# Patient Record
Sex: Female | Born: 1970 | Race: White | Hispanic: No | Smoking: Current every day smoker
Health system: Southern US, Community
[De-identification: ages and names within clinical notes are randomized; demographics above are authoritative.]

## PROBLEM LIST (undated history)

## (undated) DIAGNOSIS — F410 Panic disorder [episodic paroxysmal anxiety] without agoraphobia: Secondary | ICD-10-CM

## (undated) DIAGNOSIS — F99 Mental disorder, not otherwise specified: Secondary | ICD-10-CM

## (undated) DIAGNOSIS — F32A Depression, unspecified: Secondary | ICD-10-CM

## (undated) DIAGNOSIS — F1910 Other psychoactive substance abuse, uncomplicated: Secondary | ICD-10-CM

## (undated) DIAGNOSIS — F329 Major depressive disorder, single episode, unspecified: Secondary | ICD-10-CM

## (undated) DIAGNOSIS — F419 Anxiety disorder, unspecified: Secondary | ICD-10-CM

## (undated) DIAGNOSIS — K760 Fatty (change of) liver, not elsewhere classified: Secondary | ICD-10-CM

## (undated) DIAGNOSIS — D649 Anemia, unspecified: Secondary | ICD-10-CM

## (undated) HISTORY — PX: TUBAL LIGATION: SHX77

## (undated) HISTORY — DX: Other psychoactive substance abuse, uncomplicated: F19.10

## (undated) HISTORY — DX: Anemia, unspecified: D64.9

## (undated) HISTORY — DX: Fatty (change of) liver, not elsewhere classified: K76.0

## (undated) HISTORY — PX: OTHER SURGICAL HISTORY: SHX169

## (undated) HISTORY — PX: SPINE SURGERY: SHX786

## (undated) HISTORY — DX: Depression, unspecified: F32.A

## (undated) HISTORY — DX: Mental disorder, not otherwise specified: F99

## (undated) HISTORY — PX: BACK SURGERY: SHX140

## (undated) HISTORY — DX: Major depressive disorder, single episode, unspecified: F32.9

---

## 2000-12-19 ENCOUNTER — Emergency Department (HOSPITAL_COMMUNITY): Admission: EM | Admit: 2000-12-19 | Discharge: 2000-12-19 | Payer: Self-pay | Admitting: Emergency Medicine

## 2002-03-23 ENCOUNTER — Ambulatory Visit (HOSPITAL_COMMUNITY): Admission: RE | Admit: 2002-03-23 | Discharge: 2002-03-23 | Payer: Self-pay | Admitting: Obstetrics and Gynecology

## 2002-03-23 ENCOUNTER — Encounter: Payer: Self-pay | Admitting: Obstetrics and Gynecology

## 2002-03-27 ENCOUNTER — Ambulatory Visit (HOSPITAL_COMMUNITY): Admission: RE | Admit: 2002-03-27 | Discharge: 2002-03-27 | Payer: Self-pay | Admitting: Obstetrics & Gynecology

## 2002-03-27 ENCOUNTER — Encounter: Payer: Self-pay | Admitting: Obstetrics & Gynecology

## 2002-04-03 ENCOUNTER — Encounter (HOSPITAL_COMMUNITY): Admission: RE | Admit: 2002-04-03 | Discharge: 2002-05-03 | Payer: Self-pay | Admitting: Obstetrics & Gynecology

## 2002-04-03 ENCOUNTER — Encounter: Payer: Self-pay | Admitting: Obstetrics & Gynecology

## 2003-04-18 ENCOUNTER — Ambulatory Visit (HOSPITAL_COMMUNITY): Admission: RE | Admit: 2003-04-18 | Discharge: 2003-04-18 | Payer: Self-pay | Admitting: Obstetrics and Gynecology

## 2005-06-14 ENCOUNTER — Emergency Department (HOSPITAL_COMMUNITY): Admission: EM | Admit: 2005-06-14 | Discharge: 2005-06-15 | Payer: Self-pay | Admitting: Emergency Medicine

## 2005-06-22 ENCOUNTER — Ambulatory Visit: Payer: Self-pay | Admitting: Family Medicine

## 2005-07-05 ENCOUNTER — Ambulatory Visit: Payer: Self-pay | Admitting: Family Medicine

## 2005-07-07 ENCOUNTER — Ambulatory Visit (HOSPITAL_COMMUNITY): Admission: RE | Admit: 2005-07-07 | Discharge: 2005-07-07 | Payer: Self-pay | Admitting: Family Medicine

## 2005-07-29 ENCOUNTER — Observation Stay (HOSPITAL_COMMUNITY): Admission: RE | Admit: 2005-07-29 | Discharge: 2005-07-30 | Payer: Self-pay | Admitting: Neurosurgery

## 2005-08-30 ENCOUNTER — Encounter (HOSPITAL_COMMUNITY): Admission: RE | Admit: 2005-08-30 | Discharge: 2005-09-29 | Payer: Self-pay | Admitting: Neurosurgery

## 2005-11-02 ENCOUNTER — Emergency Department (HOSPITAL_COMMUNITY): Admission: EM | Admit: 2005-11-02 | Discharge: 2005-11-02 | Payer: Self-pay | Admitting: Family Medicine

## 2006-01-26 ENCOUNTER — Emergency Department (HOSPITAL_COMMUNITY): Admission: EM | Admit: 2006-01-26 | Discharge: 2006-01-26 | Payer: Self-pay | Admitting: Emergency Medicine

## 2006-10-06 ENCOUNTER — Ambulatory Visit (HOSPITAL_COMMUNITY): Admission: RE | Admit: 2006-10-06 | Discharge: 2006-10-06 | Payer: Self-pay | Admitting: Family Medicine

## 2007-06-15 ENCOUNTER — Encounter: Payer: Self-pay | Admitting: Family Medicine

## 2007-06-30 ENCOUNTER — Emergency Department (HOSPITAL_COMMUNITY): Admission: EM | Admit: 2007-06-30 | Discharge: 2007-06-30 | Payer: Self-pay | Admitting: Emergency Medicine

## 2007-07-17 ENCOUNTER — Ambulatory Visit (HOSPITAL_COMMUNITY): Admission: RE | Admit: 2007-07-17 | Discharge: 2007-07-17 | Payer: Self-pay | Admitting: Family Medicine

## 2007-08-02 ENCOUNTER — Encounter (HOSPITAL_COMMUNITY): Admission: RE | Admit: 2007-08-02 | Discharge: 2007-09-01 | Payer: Self-pay | Admitting: Specialist

## 2008-05-21 ENCOUNTER — Ambulatory Visit (HOSPITAL_COMMUNITY): Admission: RE | Admit: 2008-05-21 | Discharge: 2008-05-21 | Payer: Self-pay | Admitting: Specialist

## 2010-07-16 NOTE — Letter (Signed)
Summary: Historic Patient File  Historic Patient File   Imported By: Lind Guest 03/16/2010 09:39:11  _____________________________________________________________________  External Attachment:    Type:   Image     Comment:   External Document

## 2014-03-26 ENCOUNTER — Other Ambulatory Visit (HOSPITAL_COMMUNITY): Payer: Self-pay | Admitting: Internal Medicine

## 2014-03-26 DIAGNOSIS — R1011 Right upper quadrant pain: Secondary | ICD-10-CM

## 2014-03-29 ENCOUNTER — Ambulatory Visit (HOSPITAL_COMMUNITY)
Admission: RE | Admit: 2014-03-29 | Discharge: 2014-03-29 | Disposition: A | Discharge status: Home / Self Care | Payer: BC Managed Care – PPO | Source: Ambulatory Visit | Attending: Internal Medicine | Admitting: Internal Medicine

## 2014-03-29 ENCOUNTER — Other Ambulatory Visit (HOSPITAL_COMMUNITY): Payer: Self-pay | Admitting: Internal Medicine

## 2014-03-29 DIAGNOSIS — R1011 Right upper quadrant pain: Secondary | ICD-10-CM | POA: Insufficient documentation

## 2016-01-10 ENCOUNTER — Emergency Department (HOSPITAL_COMMUNITY)
Admission: EM | Admit: 2016-01-10 | Discharge: 2016-01-10 | Disposition: A | Discharge status: Home / Self Care | Payer: Self-pay | Attending: Emergency Medicine | Admitting: Emergency Medicine

## 2016-01-10 ENCOUNTER — Encounter (HOSPITAL_COMMUNITY): Payer: Self-pay

## 2016-01-10 DIAGNOSIS — F419 Anxiety disorder, unspecified: Secondary | ICD-10-CM | POA: Insufficient documentation

## 2016-01-10 DIAGNOSIS — F17210 Nicotine dependence, cigarettes, uncomplicated: Secondary | ICD-10-CM | POA: Insufficient documentation

## 2016-01-10 HISTORY — DX: Panic disorder (episodic paroxysmal anxiety): F41.0

## 2016-01-10 HISTORY — DX: Anxiety disorder, unspecified: F41.9

## 2016-01-10 MED ORDER — DULOXETINE HCL 20 MG PO CPEP: 20.0000 mg | ORAL_CAPSULE | Freq: Two times a day (BID) | ORAL | 1 refills | Status: DC

## 2016-01-10 MED ORDER — DULOXETINE HCL 20 MG PO CPEP
20.0000 mg | ORAL_CAPSULE | Freq: Once | ORAL | Status: AC
  Administered 2016-01-10: 20 mg via ORAL
  Filled 2016-01-10: qty 1

## 2016-01-10 NOTE — ED Provider Notes (Signed)
[facility]EM[facility]Y DEPT Provider Note   CSN: [id number] Arrival date & time: 01/10/16  2111  First Provider Contact:   First MD Initiated Contact with Patient 01/10/16 2144       By signing my name below, I, Jennifer Greer, attest that this documentation has been prepared under the direction and in the presence of Raeford Razor, MD. Electronically Signed: Doreatha Greer, ED Scribe. 01/10/16. 9:51 PM.    History   Chief Complaint Chief Complaint  Patient presents with  . Panic Attack    HPI Jennifer Greer is a 45 y.o. female with associated anxiety who presents to the Emergency Department complaining of generalized weakness onset 2 days ago with associated anxiety. Pt states she ran out of her bid 20 mg Cymbalta 2 days ago because she is not able to afford the medication. Per pt, Dr. Margo Aye has prescribed this medication for 2 years and is aware that she has had trouble affording the medication. She reports that she currently has Xanax, but it does not provide relief on its own.  The history is provided by the patient. No language interpreter was used.    Past Medical History:  Diagnosis Date  . Anxiety   . Panic attack     There are no active problems to display for this patient.   Past Surgical History:  Procedure Laterality Date  . BACK SURGERY    . rotar cuff repair    . TUBAL LIGATION      OB History    No data available       Home Medications    Prior to Admission medications   Not on File    Family History History reviewed. No pertinent family history.  Social History Social History  Substance Use Topics  . Smoking status: Current Every Day Smoker    Packs/day: 1.00    Types: Cigarettes  . Smokeless tobacco: Never Used  . Alcohol use No     Allergies   Review of patient's allergies indicates no known allergies.   Review of Systems Review of Systems  Neurological: Positive for weakness (generalized).  Psychiatric/Behavioral: The patient is  nervous/anxious.   All other systems reviewed and are negative.    Physical Exam Updated Vital Signs BP 132/87 (BP Location: Left Arm)   Pulse 97   Temp 98.4 F (36.9 C)   Resp 20   Ht 5\' 9"  (1.753 m)   Wt 156 lb (70.8 kg)   LMP 12/13/2015 (Approximate)   SpO2 100%   BMI 23.04 kg/m   Physical Exam  Constitutional: She appears well-developed and well-nourished. No distress.  HENT:  Head: Normocephalic and atraumatic.  Mouth/Throat: Oropharynx is clear and moist. No oropharyngeal exudate.  Eyes: Conjunctivae and EOM are normal. Pupils are equal, round, and reactive to light. Right eye exhibits no discharge. Left eye exhibits no discharge. No scleral icterus.  Neck: Normal range of motion. Neck supple. No JVD present. No thyromegaly present.  Cardiovascular: Normal rate, regular rhythm, normal heart sounds and intact distal pulses.  Exam reveals no gallop and no friction rub.   No murmur heard. Pulmonary/Chest: Effort normal and breath sounds normal. No respiratory distress. She has no wheezes. She has no rales.  Abdominal: Soft. Bowel sounds are normal. She exhibits no distension and no mass. There is no tenderness.  Musculoskeletal: Normal range of motion. She exhibits no edema or tenderness.  Lymphadenopathy:    She has no cervical adenopathy.  Neurological: She is alert. Coordination normal.  Skin: Skin is warm and dry. No rash noted. No erythema.  Psychiatric: She has a normal mood and affect. Her behavior is normal.  Nursing note and vitals reviewed.    ED Treatments / Results  Labs (all labs ordered are listed, but only abnormal results are displayed) Labs Reviewed - No data to display  EKG  EKG Interpretation None       Radiology No results found.  Procedures Procedures (including critical care time)  DIAGNOSTIC STUDIES: Oxygen Saturation is 100% on RA, normal by my interpretation.    COORDINATION OF CARE: 9:49 PM Discussed treatment plan with pt at  bedside which includes medication refill and pt agreed to plan.    Medications Ordered in ED Medications - No data to display   Initial Impression / Assessment and Plan / ED Course  I have reviewed the triage vital signs and the nursing notes.  Pertinent labs & imaging results that were available during my care of the patient were reviewed by me and considered in my medical decision making (see chart for details).  Clinical Course      Final Clinical Impressions(s) / ED Diagnoses   Final diagnoses:  Anxiety    New Prescriptions New Prescriptions   No medications on file   I personally preformed the services scribed in my presence. The recorded information has been reviewed is accurate. Raeford Razor, MD.    Raeford Razor, MD 01/19/16 [phone]

## 2016-01-10 NOTE — ED Triage Notes (Signed)
I have been off Cymbalta for two days.  Heart palpitations, brain zaps, and hot flashes.

## 2016-02-11 ENCOUNTER — Ambulatory Visit: Payer: Self-pay | Admitting: Obstetrics & Gynecology

## 2016-02-17 ENCOUNTER — Ambulatory Visit: Payer: Self-pay | Admitting: Obstetrics and Gynecology

## 2016-03-29 ENCOUNTER — Other Ambulatory Visit: Payer: Self-pay | Admitting: Adult Health

## 2016-04-06 ENCOUNTER — Encounter: Payer: Self-pay | Admitting: Adult Health

## 2016-04-06 ENCOUNTER — Ambulatory Visit (INDEPENDENT_AMBULATORY_CARE_PROVIDER_SITE_OTHER): Payer: Self-pay | Admitting: Adult Health

## 2016-04-06 ENCOUNTER — Other Ambulatory Visit (HOSPITAL_COMMUNITY)
Admission: RE | Admit: 2016-04-06 | Discharge: 2016-04-06 | Disposition: A | Discharge status: Home / Self Care | Payer: Self-pay | Source: Ambulatory Visit | Attending: Adult Health | Admitting: Adult Health

## 2016-04-06 VITALS — BP 110/62 | HR 96 | Ht 68.5 in | Wt 184.5 lb

## 2016-04-06 DIAGNOSIS — F329 Major depressive disorder, single episode, unspecified: Secondary | ICD-10-CM

## 2016-04-06 DIAGNOSIS — Z1212 Encounter for screening for malignant neoplasm of rectum: Secondary | ICD-10-CM

## 2016-04-06 DIAGNOSIS — Z01419 Encounter for gynecological examination (general) (routine) without abnormal findings: Secondary | ICD-10-CM | POA: Insufficient documentation

## 2016-04-06 DIAGNOSIS — Z1151 Encounter for screening for human papillomavirus (HPV): Secondary | ICD-10-CM | POA: Insufficient documentation

## 2016-04-06 DIAGNOSIS — F32A Depression, unspecified: Secondary | ICD-10-CM

## 2016-04-06 DIAGNOSIS — Z01411 Encounter for gynecological examination (general) (routine) with abnormal findings: Secondary | ICD-10-CM

## 2016-04-06 DIAGNOSIS — N920 Excessive and frequent menstruation with regular cycle: Secondary | ICD-10-CM

## 2016-04-06 LAB — HEMOCCULT GUIAC POC 1CARD (OFFICE): Fecal Occult Blood, POC: NEGATIVE
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
## 2016-04-06 NOTE — Patient Instructions (Addendum)
Physical in 1 year Pap in 3 if normal Get mammogram  Labs in near future Get medicaid

## 2016-04-08 LAB — CYTOLOGY - PAP
Adequacy: ABSENT
Diagnosis: NEGATIVE
HPV (WINDOPATH): NOT DETECTED

## 2016-04-27 ENCOUNTER — Telehealth: Payer: Self-pay | Admitting: Adult Health

## 2016-04-27 NOTE — Telephone Encounter (Signed)
Spoke with pt. Pt is on Cymbalta 20 mg 1 BID. She has an appt to see you 05/04/16. Can you prescribe enough to last her until her appt? Thanks!! JSY

## 2016-04-27 NOTE — Telephone Encounter (Signed)
Spoke with pt letting her know she needs to get Cymbalta from PCP. Pt voiced understanding. JSY

## 2016-05-04 ENCOUNTER — Ambulatory Visit: Payer: Self-pay | Admitting: Adult Health

## 2016-05-17 ENCOUNTER — Ambulatory Visit: Payer: Self-pay | Admitting: Adult Health

## 2016-06-16 ENCOUNTER — Ambulatory Visit: Payer: Self-pay | Admitting: Adult Health

## 2016-07-15 ENCOUNTER — Encounter: Payer: Self-pay | Admitting: Family Medicine

## 2016-07-15 ENCOUNTER — Ambulatory Visit (INDEPENDENT_AMBULATORY_CARE_PROVIDER_SITE_OTHER): Payer: BLUE CROSS/BLUE SHIELD | Admitting: Family Medicine

## 2016-07-15 VITALS — BP 120/78 | HR 100 | Temp 98.1°F | Resp 16 | Ht 69.0 in | Wt 178.0 lb

## 2016-07-15 DIAGNOSIS — Z1231 Encounter for screening mammogram for malignant neoplasm of breast: Secondary | ICD-10-CM

## 2016-07-15 DIAGNOSIS — Z1239 Encounter for other screening for malignant neoplasm of breast: Secondary | ICD-10-CM

## 2016-07-15 DIAGNOSIS — F339 Major depressive disorder, recurrent, unspecified: Secondary | ICD-10-CM | POA: Diagnosis not present

## 2016-07-15 DIAGNOSIS — F1320 Sedative, hypnotic or anxiolytic dependence, uncomplicated: Secondary | ICD-10-CM | POA: Insufficient documentation

## 2016-07-15 DIAGNOSIS — F410 Panic disorder [episodic paroxysmal anxiety] without agoraphobia: Secondary | ICD-10-CM | POA: Diagnosis not present

## 2016-07-15 DIAGNOSIS — K760 Fatty (change of) liver, not elsewhere classified: Secondary | ICD-10-CM | POA: Diagnosis not present

## 2016-07-15 DIAGNOSIS — Z720 Tobacco use: Secondary | ICD-10-CM

## 2016-07-15 DIAGNOSIS — Z7689 Persons encountering health services in other specified circumstances: Secondary | ICD-10-CM | POA: Diagnosis not present

## 2016-07-15 DIAGNOSIS — L409 Psoriasis, unspecified: Secondary | ICD-10-CM

## 2016-07-15 MED ORDER — DULOXETINE HCL 20 MG PO CPEP: 20.0000 mg | ORAL_CAPSULE | Freq: Two times a day (BID) | ORAL | 1 refills | Status: DC

## 2016-07-15 MED ORDER — FUSION PLUS PO CAPS: 1.0000 | ORAL_CAPSULE | Freq: Every day | ORAL | 3 refills | Status: DC

## 2016-07-15 NOTE — Progress Notes (Signed)
Chief Complaint  Patient presents with  . Establish Care   New to establish Just moved back to [place] Separated from husband Accompanied by teenage daughter  Long history anxiety and depression.  Is on duloxetine 20 BID.  Needs Rx today. She also takes xanax 1 mg BID.  She buys this from "friends" because doctors have stopped prescribing.  She cannot get through a day without it.  Has been on it since her 98s. Previously "drank too much".  Had increased liver functions and was told to stop drinking by her prior doctor.  She was able to do this on her own and lost 80 lbs.  I have discussed the multiple health risks associated with cigarette smoking including, but not limited to, cardiovascular disease, lung disease and cancer.  I have strongly recommended that smoking be stopped.  I have reviewed the various methods of quitting including cold [place], classes, nicotine replacements and prescription medications.  I have offered assistance in this difficult process.  The patient is not interested in assistance at this time.    Patient Active Problem List   Diagnosis Date Noted  . Depression, recurrent (HCC) 07/15/2016  . Panic disorder 07/15/2016  . Fatty liver 07/15/2016  . Benzodiazepine dependence (HCC) 07/15/2016  . Psoriasis 07/15/2016  . Tobacco abuse 07/15/2016    Outpatient Encounter Prescriptions as of 07/15/2016  Medication Sig  . ALPRAZolam (XANAX) 1 MG tablet Take 1 mg by mouth 3 (three) times daily as needed. Takes   . DULoxetine (CYMBALTA) 20 MG capsule Take 1 capsule (20 mg total) by mouth 2 (two) times daily.  . Iron-FA-B Cmp-C-Biot-Probiotic (FUSION PLUS) CAPS Take 1 capsule by mouth daily.   No facility-administered encounter medications on file as of 07/15/2016.     Past Medical History:  Diagnosis Date  . Anemia   . Anxiety   . Depression   . Fatty liver   . Mental disorder   . Panic attack   . Substance abuse     Past Surgical History:  Procedure  Laterality Date  . BACK SURGERY    . rotar cuff repair    . SPINE SURGERY    . TUBAL LIGATION      Social History   Social History  . Marital status: Legally Separated    Spouse name: N/A  . Number of children: 3  . Years of education: 78   Occupational History  . HAIR STYLIST    Social History Main Topics  . Smoking status: Current Every Day Smoker    Packs/day: 0.50    Years: 15.00    Types: Cigarettes    Start date: 06/14/1984  . Smokeless tobacco: Never Used     Comment: DISCUSSED  . Alcohol use No     Comment: not now  . Drug use: No  . Sexual activity: Not Currently    Birth control/ protection: Surgical     Comment: tubal   Other Topics Concern  . Not on file   Social History Narrative   Lives with daughter in [place]   Separated   Works as hairdresser    Family History  Problem Relation Age of Onset  . Emphysema Paternal Grandfather   . Emphysema Paternal Grandmother   . Breast cancer Maternal Grandmother   . Leukemia Maternal Grandmother   . Diabetes Maternal Grandmother   . Hypertension Maternal Grandmother   . Stroke Maternal Grandmother   . Heart attack Maternal Grandmother   . Cancer Maternal Grandfather  brain  . Panic disorder Father   . Hypertension Father   . Stroke Father   . Diabetes Father   . Other Father     heart issues  . Heart disease Father   . Hyperlipidemia Father   . Panic disorder Mother   . Hypertension Mother   . Anxiety disorder Sister   . Panic disorder Sister   . Hypertension Son     Review of Systems  Constitutional: Negative for chills, fever and weight loss.  HENT: Negative for congestion and hearing loss.   Eyes: Negative for blurred vision and pain.  Respiratory: Negative for cough and shortness of breath.   Cardiovascular: Negative for chest pain and leg swelling.  Gastrointestinal: Negative for abdominal pain, constipation, diarrhea and heartburn.  Genitourinary: Negative for dysuria and  frequency.  Musculoskeletal: Negative for falls, joint pain and myalgias.  Neurological: Negative for dizziness, seizures and headaches.  Psychiatric/Behavioral: Positive for depression and substance abuse. The patient is nervous/anxious. The patient does not have insomnia.    BP 120/78 (BP Location: Right Arm, Patient Position: Sitting, Cuff Size: Normal)   Pulse 100   Temp 98.1 F (36.7 C) (Oral)   Resp 16   Ht 5\' 9"  (1.753 m)   Wt 178 lb (80.7 kg)   LMP 06/15/2016 (Exact Date)   SpO2 99%   BMI 26.29 kg/m   Physical Exam  Constitutional: She is oriented to person, place, and time. She appears well-developed and well-nourished.  HENT:  Head: Normocephalic and atraumatic.  Mouth/Throat: Oropharynx is clear and moist.  Eyes: Conjunctivae are normal. Pupils are equal, round, and reactive to light.  Neck: Normal range of motion. Neck supple. No thyromegaly present.  Cardiovascular: Normal rate, regular rhythm and normal heart sounds.   Pulmonary/Chest: Effort normal and breath sounds normal. No respiratory distress.  Musculoskeletal: Normal range of motion. She exhibits no edema.  Lymphadenopathy:    She has no cervical adenopathy.  Neurological: She is alert and oriented to person, place, and time.  Gait normal  Skin: Skin is warm and dry.  Psychiatric: Her behavior is normal. Thought content normal.  Nervous, slightly jittery  Nursing note and vitals reviewed.  ASSESSMENT/PLAN:  1. Depression, recurrent (HCC)  - CBC - Comprehensive metabolic panel - Lipid panel - Urinalysis, Routine w reflex microscopic - VITAMIN D 25 Hydroxy (Vit-D Deficiency, Fractures) - TSH  2. Panic disorder   3. Fatty liver   4. Benzodiazepine dependence (HCC)  Discussed taking another person's controlled substance is a felony.  Recommend referral to psychiatry to try to get off of the benzodiazepine medicine. - Ambulatory referral to Psychiatry  5. Screening for breast cancer  - MM  Digital Screening; Future  6. Psoriasis   7. Tobacco abuse   8. Encounter to establish care with new doctor    Patient Instructions  Need lab tests Need referral to psychiatry Need old records please I have ordered a mammogram Follow up with GYN about hot flashes and menstrual problems  See  me in a couple of months    Eustace MooreYvonne Sue Poonam Woehrle, MD

## 2016-07-15 NOTE — Patient Instructions (Addendum)
Need lab tests Need referral to psychiatry Need old records please I have ordered a mammogram Follow up with GYN about hot flashes and menstrual problems  See  me in a couple of months

## 2016-07-27 ENCOUNTER — Telehealth: Payer: Self-pay | Admitting: Family Medicine

## 2016-07-27 MED ORDER — OSELTAMIVIR PHOSPHATE 75 MG PO CAPS: 75.0000 mg | ORAL_CAPSULE | Freq: Two times a day (BID) | ORAL | 0 refills | Status: AC

## 2016-07-27 NOTE — Telephone Encounter (Signed)
Spoke to Jennifer Greer, states she woke up @ 0700 with a fever of 101, aching all over, lethargic,

## 2016-07-27 NOTE — Telephone Encounter (Signed)
Left message to call back  

## 2016-07-27 NOTE — Telephone Encounter (Signed)
Jennifer Greer is c/o flu like symptoms since early this morning, fever, body ache, headache really bad, please adivse

## 2016-07-27 NOTE — Telephone Encounter (Signed)
See if she has a fever and likely influenza, or just a virus.  If flu, we will prescribe tamiflu

## 2016-07-29 ENCOUNTER — Ambulatory Visit: Payer: Self-pay | Admitting: Family Medicine

## 2016-09-13 ENCOUNTER — Ambulatory Visit: Payer: BLUE CROSS/BLUE SHIELD | Admitting: Family Medicine

## 2016-09-16 ENCOUNTER — Ambulatory Visit: Payer: BLUE CROSS/BLUE SHIELD | Admitting: Family Medicine

## 2016-09-27 ENCOUNTER — Ambulatory Visit: Payer: BLUE CROSS/BLUE SHIELD | Admitting: Family Medicine

## 2016-09-30 NOTE — Progress Notes (Deleted)
Psychiatric Initial Adult Assessment   Patient Identification: Jennifer Greer MRN:  [id number] Date of Evaluation:  09/30/2016 Referral Source: *** Chief Complaint:   Visit Diagnosis: No diagnosis found.  History of Present Illness:   Jennifer Greer is  46 year old female with alcohol use disorder, benzodiazepine use disorder, depression, anxiety, fatty liver, who is referred for depression and benzodiazepine use.   UDS? NCCS database: none  Associated Signs/Symptoms: Depression Symptoms:  {DEPRESSION SYMPTOMS:20000} (Hypo) Manic Symptoms:  {BHH MANIC SYMPTOMS:22872} Anxiety Symptoms:  {BHH ANXIETY SYMPTOMS:22873} Psychotic Symptoms:  {BHH PSYCHOTIC SYMPTOMS:22874} PTSD Symptoms: {BHH PTSD SYMPTOMS:22875}  Past Psychiatric History: ***  Previous Psychotropic Medications: {YES/NO:21197}  Substance Abuse History in the last 12 months:  {yes no:314532}  Consequences of Substance Abuse: {BHH CONSEQUENCES OF SUBSTANCE ABUSE:22880}  Past Medical History:  Past Medical History:  Diagnosis Date  . Anemia   . Anxiety   . Depression   . Fatty liver   . Mental disorder   . Panic attack   . Substance abuse     Past Surgical History:  Procedure Laterality Date  . BACK SURGERY    . rotar cuff repair    . SPINE SURGERY    . TUBAL LIGATION      Family Psychiatric History: ***  Family History:  Family History  Problem Relation Age of Onset  . Emphysema Paternal Grandfather   . Emphysema Paternal Grandmother   . Breast cancer Maternal Grandmother   . Leukemia Maternal Grandmother   . Diabetes Maternal Grandmother   . Hypertension Maternal Grandmother   . Stroke Maternal Grandmother   . Heart attack Maternal Grandmother   . Cancer Maternal Grandfather     brain  . Panic disorder Father   . Hypertension Father   . Stroke Father   . Diabetes Father   . Other Father     heart issues  . Heart disease Father   . Hyperlipidemia Father   . Panic disorder Mother   .  Hypertension Mother   . Anxiety disorder Sister   . Panic disorder Sister   . Hypertension Son     Social History:   Social History   Social History  . Marital status: Legally Separated    Spouse name: N/A  . Number of children: 3  . Years of education: 21   Occupational History  . HAIR STYLIST    Social History Main Topics  . Smoking status: Current Every Day Smoker    Packs/day: 0.50    Years: 15.00    Types: Cigarettes    Start date: 06/14/1984  . Smokeless tobacco: Never Used     Comment: DISCUSSED  . Alcohol use No     Comment: not now  . Drug use: No  . Sexual activity: Not Currently    Birth control/ protection: Surgical     Comment: tubal   Other Topics Concern  . Not on file   Social History Narrative   Lives with daughter in [place]   Separated   Works as hairdresser    Additional Social History: ***  Allergies:  No Known Allergies  Metabolic Disorder Labs: No results found for: HGBA1C, MPG No results found for: PROLACTIN No results found for: CHOL, TRIG, HDL, CHOLHDL, VLDL, LDLCALC   Current Medications: Current Outpatient Prescriptions  Medication Sig Dispense Refill  . ALPRAZolam (XANAX) 1 MG tablet Take 1 mg by mouth 3 (three) times daily as needed. Takes     . DULoxetine (CYMBALTA) 20  MG capsule Take 1 capsule (20 mg total) by mouth 2 (two) times daily. 180 capsule 1  . Iron-FA-B Cmp-C-Biot-Probiotic (FUSION PLUS PO) Take by mouth. Takes 1 monthly    . Iron-FA-B Cmp-C-Biot-Probiotic (FUSION PLUS) CAPS Take 1 capsule by mouth daily. 100 capsule 3   No current facility-administered medications for this visit.     Neurologic: Headache: No Seizure: No Paresthesias:No  Musculoskeletal: Strength & Muscle Tone: within normal limits Gait & Station: normal Patient leans: N/A  Psychiatric Specialty Exam: ROS  There were no vitals taken for this visit.There is no height or weight on file to calculate BMI.  General Appearance: Fairly  Groomed  Eye Contact:  Good  Speech:  Clear and Coherent  Volume:  Normal  Mood:  {BHH MOOD:22306}  Affect:  {Affect (PAA):22687}  Thought Process:  Coherent and Goal Directed  Orientation:  Full (Time, Place, and Person)  Thought Content:  Logical  Suicidal Thoughts:  {ST/HT (PAA):22692}  Homicidal Thoughts:  {ST/HT (PAA):22692}  Memory:  Immediate;   Good Recent;   Good Remote;   Good  Judgement:  {Judgement (PAA):22694}  Insight:  {Insight (PAA):22695}  Psychomotor Activity:  Normal  Concentration:  Concentration: Good and Attention Span: Good  Recall:  Good  Fund of Knowledge:Good  Language: Good  Akathisia:  No  Handed:  Right  AIMS (if indicated):  N/A  Assets:  Communication Skills Desire for Improvement  ADL's:  Intact  Cognition: WNL  Sleep:  ***   Assessment  Plan  The patient demonstrates the following risk factors for suicide: Chronic risk factors for suicide include: {Chronic Risk Factors for AOZHYQM:57846962}. Acute risk factors for suicide include: {Acute Risk Factors for XBMWUXL:24401027}. Protective factors for this patient include: {Protective Factors for Suicide OZDG:64403474}. Considering these factors, the overall suicide risk at this point appears to be {Desc; low/moderate/high:110033}. Patient {ACTION; IS/IS QVZ:56387564} appropriate for outpatient follow up.   Treatment Plan Summary: {CHL AMB [facility] MD [place] PPIR:[id number]}   Neysa Hotter, MD 4/19/20189:46 AM

## 2016-10-11 ENCOUNTER — Ambulatory Visit (HOSPITAL_COMMUNITY): Payer: Self-pay | Admitting: Psychiatry

## 2016-11-18 ENCOUNTER — Telehealth (HOSPITAL_COMMUNITY): Payer: Self-pay | Admitting: *Deleted

## 2016-11-18 NOTE — Telephone Encounter (Signed)
left voice message regarding appointment. 

## 2016-11-26 ENCOUNTER — Encounter (HOSPITAL_COMMUNITY): Payer: Self-pay | Admitting: Emergency Medicine

## 2016-11-26 ENCOUNTER — Emergency Department (HOSPITAL_COMMUNITY): Payer: BLUE CROSS/BLUE SHIELD

## 2016-11-26 ENCOUNTER — Emergency Department (HOSPITAL_COMMUNITY)
Admission: EM | Admit: 2016-11-26 | Discharge: 2016-11-26 | Disposition: A | Discharge status: Home / Self Care | Payer: BLUE CROSS/BLUE SHIELD | Attending: Emergency Medicine | Admitting: Emergency Medicine

## 2016-11-26 DIAGNOSIS — X501XXA Overexertion from prolonged static or awkward postures, initial encounter: Secondary | ICD-10-CM | POA: Insufficient documentation

## 2016-11-26 DIAGNOSIS — Y990 Civilian activity done for income or pay: Secondary | ICD-10-CM | POA: Insufficient documentation

## 2016-11-26 DIAGNOSIS — Y929 Unspecified place or not applicable: Secondary | ICD-10-CM | POA: Diagnosis not present

## 2016-11-26 DIAGNOSIS — S99912A Unspecified injury of left ankle, initial encounter: Secondary | ICD-10-CM | POA: Diagnosis present

## 2016-11-26 DIAGNOSIS — F17210 Nicotine dependence, cigarettes, uncomplicated: Secondary | ICD-10-CM | POA: Diagnosis not present

## 2016-11-26 DIAGNOSIS — Y939 Activity, unspecified: Secondary | ICD-10-CM | POA: Insufficient documentation

## 2016-11-26 MED ORDER — IBUPROFEN 800 MG PO TABS: 800.0000 mg | ORAL_TABLET | Freq: Three times a day (TID) | ORAL | 0 refills | Status: DC

## 2016-11-26 NOTE — Discharge Instructions (Signed)
Elevate and apply ice packs on/off to your ankle.  Use the crutches as needed for weight bearing.  Call the orthopedic doctor listed in one week if not improving

## 2016-11-26 NOTE — ED Notes (Signed)
Ice pack placed on left ankle.

## 2016-11-26 NOTE — ED Provider Notes (Signed)
[facility]-EM[facility]Y DEPT Provider Note   CSN: [id number] Arrival date & time: 11/26/16  1729     History   Chief Complaint Chief Complaint  Patient presents with  . Ankle Injury    HPI Jennifer Greer is a 46 y.o. female.  HPI   Jennifer Greer is a 46 y.o. female who presents to the Emergency Department complaining of pain and swelling of the lateral left ankle after a twisting injury that occurred shortly before ER arrival.  States that her ankle "rolled" as she stepped off a step at work.  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  She describes immediate swelling and inability to bear weight.  Denies numbness of the extremity, calf or knee pain or other injuries.    Past Medical History:  Diagnosis Date  . Anemia   . Anxiety   . Depression   . Fatty liver   . Mental disorder   . Panic attack   . Substance abuse     Patient Active Problem List   Diagnosis Date Noted  . Depression, recurrent (HCC) 07/15/2016  . Panic disorder 07/15/2016  . Fatty liver 07/15/2016  . Benzodiazepine dependence (HCC) 07/15/2016  . Psoriasis 07/15/2016  . Tobacco abuse 07/15/2016    Past Surgical History:  Procedure Laterality Date  . BACK SURGERY    . rotar cuff repair    . SPINE SURGERY    . TUBAL LIGATION      OB History    Gravida Para Term Preterm AB Living   5 3 3   2 3    SAB TAB Ectopic Multiple Live Births           3       Home Medications    Prior to Admission medications   Medication Sig Start Date End Date Taking? Authorizing Provider  ALPRAZolam Prudy Feeler(XANAX) 1 MG tablet Take 1 mg by mouth 3 (three) times daily as needed. Takes     [provider]  DULoxetine (CYMBALTA) 20 MG capsule Take 1 capsule (20 mg total) by mouth 2 (two) times daily. 07/15/16   Eustace MooreNelson, Yvonne Sue, MD  ibuprofen (ADVIL,MOTRIN) 800 MG tablet Take 1 tablet (800 mg total) by mouth 3 (three) times daily. 11/26/16   Winry Egnew, PA-C  Iron-FA-B Cmp-C-Biot-Probiotic (FUSION PLUS PO) Take  by mouth. Takes 1 monthly    [provider]  Iron-FA-B Cmp-C-Biot-Probiotic (FUSION PLUS) CAPS Take 1 capsule by mouth daily. 07/15/16   Eustace MooreNelson, Yvonne Sue, MD    Family History Family History  Problem Relation Age of Onset  . Emphysema Paternal Grandfather   . Emphysema Paternal Grandmother   . Breast cancer Maternal Grandmother   . Leukemia Maternal Grandmother   . Diabetes Maternal Grandmother   . Hypertension Maternal Grandmother   . Stroke Maternal Grandmother   . Heart attack Maternal Grandmother   . Cancer Maternal Grandfather        brain  . Panic disorder Father   . Hypertension Father   . Stroke Father   . Diabetes Father   . Other Father        heart issues  . Heart disease Father   . Hyperlipidemia Father   . Panic disorder Mother   . Hypertension Mother   . Anxiety disorder Sister   . Panic disorder Sister   . Hypertension Son     Social History Social History  Substance Use Topics  . Smoking status: Current Every Day Smoker  Packs/day: 0.50    Years: 15.00    Types: Cigarettes    Start date: 06/14/1984  . Smokeless tobacco: Never Used     Comment: DISCUSSED  . Alcohol use Yes     Comment: occasionally     Allergies   Patient has no known allergies.   Review of Systems Review of Systems  Constitutional: Negative for chills and fever.  Musculoskeletal: Positive for arthralgias (left ankle pain) and joint swelling. Negative for neck pain.  Skin: Negative for color change and wound.  Neurological: Negative for syncope, weakness, numbness and headaches.  All other systems reviewed and are negative.    Physical Exam Updated Vital Signs BP 134/70   Pulse 78   Temp 98.2 F (36.8 C)   Resp 18   Ht 5\' 9"  (1.753 m)   Wt 73 kg (161 lb)   LMP 11/22/2016   SpO2 99%   BMI 23.78 kg/m   Physical Exam  Constitutional: She is oriented to person, place, and time. She appears well-developed and well-nourished. No distress.  HENT:  Head:  Normocephalic and atraumatic.  Mouth/Throat: Oropharynx is clear and moist.  Cardiovascular: Normal rate, regular rhythm, normal heart sounds and intact distal pulses.   Pulmonary/Chest: Effort normal and breath sounds normal.  Musculoskeletal: She exhibits edema and tenderness. She exhibits no deformity.  ttp of the lateral left ankle with moderate edema.   No erythema, abrasion, bruising or bony deformity.  No proximal tenderness.  Compartments soft.   Neurological: She is alert and oriented to person, place, and time. She exhibits normal muscle tone. Coordination normal.  Skin: Skin is warm and dry.  Nursing note and vitals reviewed.    ED Treatments / Results  Labs (all labs ordered are listed, but only abnormal results are displayed) Labs Reviewed - No data to display  EKG  EKG Interpretation None       Radiology Dg Ankle Complete Left  Result Date: 11/26/2016 CLINICAL DATA:  Left ankle injury with swelling.  Initial encounter. EXAM: LEFT ANKLE COMPLETE - 3+ VIEW COMPARISON:  None. FINDINGS: Lateral soft tissue swelling. Negative for acute fracture or malalignment. Equivocal for small ankle joint effusion. IMPRESSION: Soft tissue swelling without fracture. Electronically Signed   By: Marnee Spring M.D.   On: 11/26/2016 18:33    Procedures Procedures (including critical care time)  Medications Ordered in ED Medications - No data to display   Initial Impression / Assessment and Plan / ED Course  I have reviewed the triage vital signs and the nursing notes.  Pertinent labs & imaging results that were available during my care of the patient were reviewed by me and considered in my medical decision making (see chart for details).     XR neg for fx, likely sprain.  NV intact.    ASO applied and crutches given.  She agrees to RICE therapy, ibuprofen and close orthopedic f/u if not improving.    Final Clinical Impressions(s) / ED Diagnoses   Final diagnoses:  Injury  of left ankle, initial encounter    New Prescriptions Discharge Medication List as of 11/26/2016  6:58 PM    START taking these medications   Details  ibuprofen (ADVIL,MOTRIN) 800 MG tablet Take 1 tablet (800 mg total) by mouth 3 (three) times daily., Starting Fri 11/26/2016, Print         [employer], [place], PA-C 11/26/16 1936    Bethann Berkshire, MD 11/26/16 [phone]

## 2016-11-26 NOTE — ED Triage Notes (Signed)
Patient states she twisted her left ankle 15 minutes prior to arrival to ER. Swelling noted to left ankle at triage.

## 2016-12-08 ENCOUNTER — Ambulatory Visit (INDEPENDENT_AMBULATORY_CARE_PROVIDER_SITE_OTHER): Payer: BLUE CROSS/BLUE SHIELD | Admitting: Psychiatry

## 2016-12-08 VITALS — BP 126/58 | HR 82 | Ht 69.29 in | Wt 179.2 lb

## 2016-12-08 DIAGNOSIS — Z79899 Other long term (current) drug therapy: Secondary | ICD-10-CM

## 2016-12-08 DIAGNOSIS — F1320 Sedative, hypnotic or anxiolytic dependence, uncomplicated: Secondary | ICD-10-CM | POA: Diagnosis not present

## 2016-12-08 DIAGNOSIS — F411 Generalized anxiety disorder: Secondary | ICD-10-CM

## 2016-12-08 DIAGNOSIS — F1021 Alcohol dependence, in remission: Secondary | ICD-10-CM

## 2016-12-08 DIAGNOSIS — Z818 Family history of other mental and behavioral disorders: Secondary | ICD-10-CM

## 2016-12-08 DIAGNOSIS — F410 Panic disorder [episodic paroxysmal anxiety] without agoraphobia: Secondary | ICD-10-CM | POA: Diagnosis not present

## 2016-12-08 DIAGNOSIS — F17210 Nicotine dependence, cigarettes, uncomplicated: Secondary | ICD-10-CM | POA: Diagnosis not present

## 2016-12-08 MED ORDER — DULOXETINE HCL 60 MG PO CPEP: 60.0000 mg | ORAL_CAPSULE | Freq: Every day | ORAL | 1 refills | Status: DC

## 2016-12-08 MED ORDER — CLONAZEPAM 1 MG PO TABS: 1.0000 mg | ORAL_TABLET | Freq: Two times a day (BID) | ORAL | 0 refills | Status: DC
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
## 2016-12-08 NOTE — Patient Instructions (Signed)
1. Increase duloxetine 60 mg daily 2. Discontinue Xanax 3. Start clonazepam 1 mg twice a day 4. Return to clinic in one month for 30 mins 5. Referral to therapy

## 2016-12-20 ENCOUNTER — Ambulatory Visit (HOSPITAL_COMMUNITY): Payer: Self-pay | Admitting: Licensed Clinical Social Worker
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
## 2017-01-05 ENCOUNTER — Encounter (HOSPITAL_COMMUNITY): Payer: Self-pay

## 2017-01-05 ENCOUNTER — Ambulatory Visit (HOSPITAL_COMMUNITY): Payer: BLUE CROSS/BLUE SHIELD | Admitting: Psychiatry

## 2017-01-11 ENCOUNTER — Ambulatory Visit: Payer: Self-pay | Admitting: Family Medicine

## 2017-01-13 ENCOUNTER — Ambulatory Visit (INDEPENDENT_AMBULATORY_CARE_PROVIDER_SITE_OTHER): Payer: BLUE CROSS/BLUE SHIELD

## 2017-01-13 ENCOUNTER — Encounter: Payer: Self-pay | Admitting: Orthopaedic Surgery

## 2017-01-13 ENCOUNTER — Ambulatory Visit (INDEPENDENT_AMBULATORY_CARE_PROVIDER_SITE_OTHER): Payer: BLUE CROSS/BLUE SHIELD | Admitting: Orthopaedic Surgery

## 2017-01-13 VITALS — BP 114/76 | HR 67 | Temp 96.7°F | Ht 70.0 in | Wt 173.0 lb

## 2017-01-13 DIAGNOSIS — M25572 Pain in left ankle and joints of left foot: Secondary | ICD-10-CM | POA: Diagnosis not present

## 2017-01-13 DIAGNOSIS — F17210 Nicotine dependence, cigarettes, uncomplicated: Secondary | ICD-10-CM

## 2017-01-13 NOTE — Progress Notes (Signed)
Subjective:    Patient ID: Jennifer LoftSusan R Greer, female    DOB: July 17, 1970, 46 y.o.   MRN: [id number]  HPI She fell and hurt her left ankle on 11-26-16.  She hurt her lateral side of the ankle more.  She was seen in the ER the same day and had x-rays which were negative.  She was given an ankle brace and used it for two weeks as directed.  Then she got a slip on anklet.  She still has lateral ankle pain.  She still has swelling.  She has no redness or ecchymosis.  She has more pain with more activity.  She has tried the anklet, ice, heat, elevation and ibuprofen.  She has no other joint pain.  She smokes and is willing to cut back.    Review of Systems  HENT: Negative for congestion.   Respiratory: Negative for cough and shortness of breath.   Cardiovascular: Negative for chest pain and leg swelling.  Endocrine: Negative for cold intolerance.  Musculoskeletal: Positive for arthralgias, gait problem and joint swelling.  Allergic/Immunologic: Negative for environmental allergies.  Psychiatric/Behavioral: The patient is nervous/anxious.    Past Medical History:  Diagnosis Date  . Anemia   . Anxiety   . Depression   . Fatty liver   . Mental disorder   . Panic attack   . Substance abuse     Past Surgical History:  Procedure Laterality Date  . BACK SURGERY    . rotar cuff repair    . SPINE SURGERY    . TUBAL LIGATION      Current Outpatient Prescriptions on File Prior to Visit  Medication Sig Dispense Refill  . clonazePAM (KLONOPIN) 1 MG tablet Take 1 tablet (1 mg total) by mouth 2 (two) times daily. 60 tablet 0  . DULoxetine (CYMBALTA) 60 MG capsule Take 1 capsule (60 mg total) by mouth daily. 30 capsule 1  . ibuprofen (ADVIL,MOTRIN) 800 MG tablet Take 1 tablet (800 mg total) by mouth 3 (three) times daily. 21 tablet 0  . Iron-FA-B Cmp-C-Biot-Probiotic (FUSION PLUS) CAPS Take 1 capsule by mouth daily. 100 capsule 3   No current facility-administered medications on file prior to  visit.     Social History   Social History  . Marital status: Legally Separated    Spouse name: N/A  . Number of children: 3  . Years of education: 6411   Occupational History  . HAIR STYLIST    Social History Main Topics  . Smoking status: Current Every Day Smoker    Packs/day: 0.50    Years: 15.00    Types: Cigarettes    Start date: 06/14/1984  . Smokeless tobacco: Never Used     Comment: DISCUSSED  . Alcohol use Yes     Comment: occasionally  . Drug use: No  . Sexual activity: Not Currently    Birth control/ protection: Surgical     Comment: tubal   Other Topics Concern  . Not on file   Social History Narrative   [place] in [place]   Separated   Works as hairdresser    Family History  Problem Relation Age of Onset  . Emphysema Paternal Grandfather   . Emphysema Paternal Grandmother   . Breast cancer Maternal Grandmother   . Leukemia Maternal Grandmother   . Diabetes Maternal Grandmother   . Hypertension Maternal Grandmother   . Stroke Maternal Grandmother   . Heart attack Maternal Grandmother   . Cancer Maternal Grandfather  brain  . Panic disorder Father   . Hypertension Father   . Stroke Father   . Diabetes Father   . Other Father        heart issues  . Heart disease Father   . Hyperlipidemia Father   . Panic disorder Mother   . Hypertension Mother   . Anxiety disorder Sister   . Panic disorder Sister   . Hypertension Son     BP 114/76   Pulse 67   Temp (!) 96.7 F (35.9 C)   Ht 5\' 10"  (1.778 m)   Wt 173 lb (78.5 kg)   LMP 12/30/2016 (Approximate)   BMI 24.82 kg/m       Objective:   Physical Exam  Constitutional: She is oriented to person, place, and time. She appears well-developed and well-nourished.  HENT:  Head: Normocephalic and atraumatic.  Eyes: Pupils are equal, round, and reactive to light. Conjunctivae and EOM are normal.  Neck: Normal range of motion. Neck supple.  Cardiovascular: Normal rate, regular  rhythm and intact distal pulses.   Pulmonary/Chest: Effort normal.  Abdominal: Soft.  Musculoskeletal: She exhibits tenderness (Left ankle with lateral swelling and pain of the anterior talofibular ligament. ROM is full but tender.  Ankle is stable.  No redness.  NV intact.  Right ankle negative.  Limp to the left.).  Neurological: She is alert and oriented to person, place, and time. She displays normal reflexes. No cranial nerve deficit. She exhibits normal muscle tone. Coordination normal.  Skin: Skin is warm and dry.  Psychiatric: She has a normal mood and affect. Her behavior is normal. Judgment and thought content normal.  Vitals reviewed.   X-rays were done of the left ankle, reported separately.      Assessment & Plan:   Encounter Diagnoses  Name Primary?  . Pain of joint of left ankle and foot Yes  . Cigarette nicotine dependence without complication    I would like her to begin PT and resume her ankle brace.  Return in two weeks.  Call if any problem.  Precautions discussed.   Electronically Signed Darreld McleanWayne Katharina Jehle, MD 8/2/20182:16 PM

## 2017-01-26 ENCOUNTER — Telehealth (HOSPITAL_COMMUNITY): Payer: Self-pay | Admitting: Physical Therapy

## 2017-01-26 ENCOUNTER — Ambulatory Visit (HOSPITAL_COMMUNITY): Payer: BLUE CROSS/BLUE SHIELD | Admitting: Physical Therapy

## 2017-01-26 ENCOUNTER — Ambulatory Visit: Payer: Self-pay | Admitting: Orthopaedic Surgery

## 2017-01-26 NOTE — Telephone Encounter (Signed)
Patient no showed for her appt secondary to being sick and called to reschedule

## 2017-01-31 ENCOUNTER — Telehealth (HOSPITAL_COMMUNITY): Payer: Self-pay | Admitting: Physical Therapy

## 2017-01-31 ENCOUNTER — Ambulatory Visit (HOSPITAL_COMMUNITY): Payer: BLUE CROSS/BLUE SHIELD | Attending: Orthopaedic Surgery | Admitting: Physical Therapy

## 2017-01-31 NOTE — Telephone Encounter (Signed)
L/m concerning missed apptment,  Pt has been schedule 2xs. NF

## 2017-02-03 ENCOUNTER — Ambulatory Visit: Payer: BLUE CROSS/BLUE SHIELD | Admitting: Orthopaedic Surgery

## 2017-02-16 ENCOUNTER — Telehealth (HOSPITAL_COMMUNITY): Payer: Self-pay | Admitting: *Deleted

## 2017-02-16 ENCOUNTER — Ambulatory Visit (INDEPENDENT_AMBULATORY_CARE_PROVIDER_SITE_OTHER): Payer: BLUE CROSS/BLUE SHIELD | Admitting: Licensed Clinical Social Worker

## 2017-02-16 ENCOUNTER — Other Ambulatory Visit (HOSPITAL_COMMUNITY): Payer: Self-pay | Admitting: Psychiatry

## 2017-02-16 DIAGNOSIS — F331 Major depressive disorder, recurrent, moderate: Secondary | ICD-10-CM

## 2017-02-16 MED ORDER — DULOXETINE HCL 20 MG PO CPEP: 20.0000 mg | ORAL_CAPSULE | Freq: Two times a day (BID) | ORAL | 0 refills | Status: DC

## 2017-02-16 NOTE — Telephone Encounter (Signed)
Pt walked into office stating she was not feeling well taking 60 mg of her Cymbalta. Per pt is made her really sleepy and could not shake it all day. Per pt she still have 20 mg left of her Cymbalta and she tried taking 20 mg in AM and 20 mg in PM and it seemed to help her. Per pt she would like to know if Dr. Vanetta ShawlHisada could fill her Cymbalta for 20 mg BID. Pt number is [phone]. Per pt chart, her f/u appt is 02-28-2017
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
## 2017-02-16 NOTE — Telephone Encounter (Signed)
Done

## 2017-02-17 NOTE — Telephone Encounter (Signed)
noted 

## 2017-02-24 NOTE — Progress Notes (Deleted)
[facility] MD/PA/NP OP Progress Note  02/24/2017 12:07 PM DIAMOND JENTZ  MRN:  [id number]  Chief Complaint:  HPI: *** Visit Diagnosis: No diagnosis found.  Past Psychiatric History:  I have reviewed the patient's psychiatry history in detail and updated the patient record. Outpatient: used to see a psychiatrist, last in 2017 Psychiatry admission: denies Previous suicide attempt: denies Past trials of medication: Paxil, duloxetine, Xanax  History of violence: denies  Past Medical History:  Past Medical History:  Diagnosis Date  . Anemia   . Anxiety   . Depression   . Fatty liver   . Mental disorder   . Panic attack   . Substance abuse     Past Surgical History:  Procedure Laterality Date  . BACK SURGERY    . rotar cuff repair    . SPINE SURGERY    . TUBAL LIGATION      Family Psychiatric History:  I have reviewed the patient's family history in detail and updated the patient record. Mother- anxiety, panic attacks, father,  maternal grandmother, sister- anxiety  Family History:  Family History  Problem Relation Age of Onset  . Emphysema Paternal Grandfather   . Emphysema Paternal Grandmother   . Breast cancer Maternal Grandmother   . Leukemia Maternal Grandmother   . Diabetes Maternal Grandmother   . Hypertension Maternal Grandmother   . Stroke Maternal Grandmother   . Heart attack Maternal Grandmother   . Cancer Maternal Grandfather        brain  . Panic disorder Father   . Hypertension Father   . Stroke Father   . Diabetes Father   . Other Father        heart issues  . Heart disease Father   . Hyperlipidemia Father   . Panic disorder Mother   . Hypertension Mother   . Anxiety disorder Sister   . Panic disorder Sister   . Hypertension Son     Social History:  Social History   Social History  . Marital status: Legally Separated    Spouse name: N/A  . Number of children: 3  . Years of education: 25   Occupational History  . HAIR STYLIST    Social  History Main Topics  . Smoking status: Current Every Day Smoker    Packs/day: 0.50    Years: 15.00    Types: Cigarettes    Start date: 06/14/1984  . Smokeless tobacco: Never Used     Comment: DISCUSSED  . Alcohol use Yes     Comment: occasionally  . Drug use: No  . Sexual activity: Not Currently    Birth control/ protection: Surgical     Comment: tubal   Other Topics Concern  . Not on file   Social History Narrative   Lives with daughter in [place]   Separated   Works as hairdresser    Allergies: No Known Allergies  Metabolic Disorder Labs: No results found for: HGBA1C, MPG No results found for: PROLACTIN No results found for: CHOL, TRIG, HDL, CHOLHDL, VLDL, LDLCALC No results found for: TSH  Therapeutic Level Labs: No results found for: LITHIUM No results found for: VALPROATE No components found for:  CBMZ  Current Medications: Current Outpatient Prescriptions  Medication Sig Dispense Refill  . clonazePAM (KLONOPIN) 1 MG tablet Take 1 tablet (1 mg total) by mouth 2 (two) times daily. 60 tablet 0  . DULoxetine (CYMBALTA) 20 MG capsule Take 1 capsule (20 mg total) by mouth 2 (two) times daily. 60 capsule  0  . ibuprofen (ADVIL,MOTRIN) 800 MG tablet Take 1 tablet (800 mg total) by mouth 3 (three) times daily. 21 tablet 0  . Iron-FA-B Cmp-C-Biot-Probiotic (FUSION PLUS) CAPS Take 1 capsule by mouth daily. 100 capsule 3   No current facility-administered medications for this visit.      Musculoskeletal: Strength & Muscle Tone: within normal limits Gait & Station: normal Patient leans: N/A  Psychiatric Specialty Exam: ROS  There were no vitals taken for this visit.There is no height or weight on file to calculate BMI.  General Appearance: Fairly Groomed  Eye Contact:  Good  Speech:  Clear and Coherent  Volume:  Normal  Mood:  {BHH MOOD:22306}  Affect:  {Affect (PAA):22687}  Thought Process:  Coherent and Goal Directed  Orientation:  Full (Time, Place, and  Person)  Thought Content: Logical   Suicidal Thoughts:  {ST/HT (PAA):22692}  Homicidal Thoughts:  {ST/HT (PAA):22692}  Memory:  Immediate;   Good Recent;   Good Remote;   Good  Judgement:  {Judgement (PAA):22694}  Insight:  {Insight (PAA):22695}  Psychomotor Activity:  Normal  Concentration:  Concentration: Good and Attention Span: Good  Recall:  Good  Fund of Knowledge: Good  Language: Good  Akathisia:  No  Handed:  Right  AIMS (if indicated): not done  Assets:  Communication Skills Desire for Improvement  ADL's:  Intact  Cognition: WNL  Sleep:  {BHH GOOD/FAIR/POOR:22877}   Screenings: PHQ2-9     Office Visit from [facility]/15/2016 in [facility] Office Visit from 04/06/2016 in [facility]  PHQ-2 Total Score  6  6  PHQ-9 Total Score  14  21       Assessment and Plan:  Jennifer Greer is a 46 y.o. year old female with a history of anxiety, depression, benzodiazepine dependence, alcohol use disorder in sustained remission, who presents for follow up appointment for No diagnosis found.  # GAD # Panic disorder  # r/o PTSD  Exam is notable for her restricted affect and patient endorses worsening anxiety in the setting of marital discordance, financial strain and Xanax use (received by her friends/family). Will uptitrate duloxetine to target anxiety. Will switch from Xanax to clonazepam to avoid withdrawal with plan to taper down in the future. She will greatly benefit from CBT to target anxiety. Will make a referral.   Plan 1. Increase duloxetine 60 mg daily 2. Discontinue Xanax 3. Start clonazepam 1 mg twice a day 4. Return to clinic in one month for 30 mins 5. Referral to therapy  The patient demonstrates the following risk factors for suicide: Chronic risk factors for suicide include: psychiatric disorder of anxiety and substance use disorder. Acute risk factors for suicide include: family or marital conflict. Protective factors for this patient include:  responsibility to others (children, family), coping skills and hope for the future. Considering these factors, the overall suicide risk at this point appears to be low. Patient is appropriate for outpatient follow up.   Jennifer Hottereina Samaiyah Howes, MD 02/24/2017, 12:07 PM

## 2017-02-28 ENCOUNTER — Other Ambulatory Visit (HOSPITAL_COMMUNITY): Payer: Self-pay | Admitting: Psychiatry

## 2017-02-28 ENCOUNTER — Ambulatory Visit (HOSPITAL_COMMUNITY): Payer: BLUE CROSS/BLUE SHIELD | Admitting: Psychiatry

## 2017-03-01 ENCOUNTER — Telehealth (HOSPITAL_COMMUNITY): Payer: Self-pay | Admitting: *Deleted

## 2017-03-01 ENCOUNTER — Telehealth (HOSPITAL_COMMUNITY): Payer: Self-pay | Admitting: Psychiatry

## 2017-03-01 ENCOUNTER — Encounter (HOSPITAL_COMMUNITY): Payer: Self-pay | Admitting: *Deleted

## 2017-03-01 MED ORDER — DULOXETINE HCL 20 MG PO CPEP: 20.0000 mg | ORAL_CAPSULE | Freq: Two times a day (BID) | ORAL | 0 refills | Status: DC

## 2017-03-01 NOTE — Telephone Encounter (Signed)
Please send a note to discharge from the clinic due to frequent no shows (three times including yesterday). Ordered another month of duloxetine.

## 2017-03-01 NOTE — Telephone Encounter (Signed)
Per staff message from provider to dismiss pt from seeing pt due to no show. Asked Sharia Reeve Sheets if she wants to also d/c pt. Per Sharia Reeve Sheets to hold off on d/c pt right now and if she no show for his appt to go ahead and d/c pt from also seeing him.

## 2017-03-08 ENCOUNTER — Encounter (HOSPITAL_COMMUNITY): Payer: Self-pay | Admitting: Licensed Clinical Social Worker

## 2017-03-08 ENCOUNTER — Ambulatory Visit (HOSPITAL_COMMUNITY): Payer: BLUE CROSS/BLUE SHIELD | Admitting: Licensed Clinical Social Worker

## 2017-03-19 ENCOUNTER — Encounter (HOSPITAL_COMMUNITY): Payer: Self-pay | Admitting: *Deleted

## 2017-04-01 ENCOUNTER — Encounter (HOSPITAL_COMMUNITY): Payer: Self-pay | Admitting: Licensed Clinical Social Worker

## 2017-04-18 ENCOUNTER — Other Ambulatory Visit (HOSPITAL_COMMUNITY): Payer: Self-pay | Admitting: Psychiatry

## 2017-04-18 ENCOUNTER — Telehealth (HOSPITAL_COMMUNITY): Payer: Self-pay | Admitting: *Deleted

## 2017-04-18 MED ORDER — DULOXETINE HCL 20 MG PO CPEP: 20.0000 mg | ORAL_CAPSULE | Freq: Two times a day (BID) | ORAL | 0 refills | Status: DC

## 2017-04-18 NOTE — Telephone Encounter (Signed)
I believe she should have 30 days of medication after she is discharged according to the chart. I ordered additional  30 days of medication. Please inform her that no more medication to be refilled.

## 2017-04-18 NOTE — Telephone Encounter (Signed)
Spoke with pt and informed her with what provider stated and pt verbalized understanding.  

## 2017-04-18 NOTE — Telephone Encounter (Signed)
Pt called office stating she received a letter stating she was d/c from practice. Per pt she only have 2 days of her medications that Dr Vanetta ShawlHisada prescribes her. Per pt she would like to have refills since it's not been 30 days since the letter was dated. Pt number is [phone].

## 2017-07-07 ENCOUNTER — Telehealth: Payer: Self-pay | Admitting: Family Medicine

## 2017-07-07 NOTE — Telephone Encounter (Signed)
Jennifer Greer is calling wants to know if you would be willing to give her another try,  Cymbalta is helping, she has a job, she is driving, would love to come back and see you   [phone]

## 2017-07-13 NOTE — Telephone Encounter (Signed)
Noted. Attempted to call patient, no answer.

## 2017-07-13 NOTE — Telephone Encounter (Signed)
You do not even need to sent me these kind of message.  NO means no.

## 2017-08-22 ENCOUNTER — Encounter: Payer: Self-pay | Admitting: Family Medicine

## 2017-11-09 ENCOUNTER — Telehealth (HOSPITAL_COMMUNITY): Payer: Self-pay | Admitting: *Deleted

## 2017-11-09 NOTE — Telephone Encounter (Signed)
Dr Vanetta Shawl Patient called asking if you would be reconsider taking her back?. She asked if you would take into consideration  all that she was going thru with the marriage ending. And that she promises she will be better about making appointments. 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 She ask if she could have refills on Cymbalta never Started the [employer]

## 2017-11-09 NOTE — Telephone Encounter (Signed)
This is our clinic policy and we would not be able to take her back. Advise her to consider other place such as [facility].

## 2017-11-17 ENCOUNTER — Emergency Department (HOSPITAL_COMMUNITY)
Admission: EM | Admit: 2017-11-17 | Discharge: 2017-11-17 | Disposition: A | Discharge status: Home / Self Care | Payer: BLUE CROSS/BLUE SHIELD | Attending: Emergency Medicine | Admitting: Emergency Medicine

## 2017-11-17 ENCOUNTER — Other Ambulatory Visit: Payer: Self-pay

## 2017-11-17 ENCOUNTER — Encounter (HOSPITAL_COMMUNITY): Payer: Self-pay | Admitting: *Deleted

## 2017-11-17 DIAGNOSIS — Z79899 Other long term (current) drug therapy: Secondary | ICD-10-CM | POA: Insufficient documentation

## 2017-11-17 DIAGNOSIS — R42 Dizziness and giddiness: Secondary | ICD-10-CM | POA: Diagnosis not present

## 2017-11-17 DIAGNOSIS — F17210 Nicotine dependence, cigarettes, uncomplicated: Secondary | ICD-10-CM | POA: Insufficient documentation

## 2017-11-17 DIAGNOSIS — R531 Weakness: Secondary | ICD-10-CM | POA: Insufficient documentation

## 2017-11-17 DIAGNOSIS — Z760 Encounter for issue of repeat prescription: Secondary | ICD-10-CM

## 2017-11-17 DIAGNOSIS — F419 Anxiety disorder, unspecified: Secondary | ICD-10-CM | POA: Diagnosis not present

## 2017-11-17 MED ORDER — DULOXETINE HCL 30 MG PO CPEP
30.0000 mg | ORAL_CAPSULE | Freq: Once | ORAL | Status: AC
  Administered 2017-11-17: 30 mg via ORAL
  Filled 2017-11-17: qty 1

## 2017-11-17 MED ORDER — DULOXETINE HCL 30 MG PO CPEP: 30.0000 mg | ORAL_CAPSULE | Freq: Every day | ORAL | Status: DC

## 2017-11-17 NOTE — ED Provider Notes (Signed)
[facility] EMERGENCY DEPARTMENT Provider Note   CSN: [id number] Arrival date & time: 11/17/17  2024     History   Chief Complaint Chief Complaint  Patient presents with  . Medication Refill    HPI Jennifer Greer is a 47 y.o. female.  Patient states that she needed her Cymbalta refill because she is been out for a few days and she started to have dizziness and weakness.  This is happened before when she was out of her medicine  The history is provided by the patient. No language interpreter was used.  Illness  This is a new problem. The current episode started 12 to 24 hours ago. The problem occurs constantly. The problem has not changed since onset.Pertinent negatives include no chest pain, no abdominal pain and no headaches. Nothing aggravates the symptoms. Nothing relieves the symptoms.    Past Medical History:  Diagnosis Date  . Anemia   . Anxiety   . Depression   . Fatty liver   . Mental disorder   . Panic attack   . Substance abuse [facility])     Patient Active Problem List   Diagnosis Date Noted  . Depression, recurrent (HCC) 07/15/2016  . Panic disorder 07/15/2016  . Fatty liver 07/15/2016  . Benzodiazepine dependence (HCC) 07/15/2016  . Psoriasis 07/15/2016  . Tobacco abuse 07/15/2016    Past Surgical History:  Procedure Laterality Date  . BACK SURGERY    . rotar cuff repair    . SPINE SURGERY    . TUBAL LIGATION       OB History    Gravida  5   Para  3   Term  3   Preterm      AB  2   Living  3     SAB      TAB      Ectopic      Multiple      Live Births  3            Home Medications    Prior to Admission medications   Medication Sig Start Date End Date Taking? Authorizing Provider  clonazePAM (KLONOPIN) 1 MG tablet Take 1 tablet (1 mg total) by mouth 2 (two) times daily. 12/08/16   Neysa Hotter, MD  DULoxetine (CYMBALTA) 30 MG capsule Take 1 capsule (30 mg total) by mouth daily. 11/17/17   Bethann Berkshire, MD  ibuprofen  (ADVIL,MOTRIN) 800 MG tablet Take 1 tablet (800 mg total) by mouth 3 (three) times daily. 11/26/16   Triplett, Tammy, PA-C  Iron-FA-B Cmp-C-Biot-Probiotic (FUSION PLUS) CAPS Take 1 capsule by mouth daily. 07/15/16   Eustace Moore, MD    Family History Family History  Problem Relation Age of Onset  . Emphysema Paternal Grandfather   . Emphysema Paternal Grandmother   . Breast cancer Maternal Grandmother   . Leukemia Maternal Grandmother   . Diabetes Maternal Grandmother   . Hypertension Maternal Grandmother   . Stroke Maternal Grandmother   . Heart attack Maternal Grandmother   . Cancer Maternal Grandfather        brain  . Panic disorder Father   . Hypertension Father   . Stroke Father   . Diabetes Father   . Other Father        heart issues  . Heart disease Father   . Hyperlipidemia Father   . Panic disorder Mother   . Hypertension Mother   . Anxiety disorder Sister   . Panic disorder Sister   .  Hypertension Son     Social History Social History   Tobacco Use  . Smoking status: Current Every Day Smoker    Packs/day: 0.50    Years: 15.00    Pack years: 7.50    Types: Cigarettes    Start date: 06/14/1984  . Smokeless tobacco: Never Used  . Tobacco comment: DISCUSSED  Substance Use Topics  . Alcohol use: Yes    Comment: occasionally  . Drug use: No     Allergies   Patient has no known allergies.   Review of Systems Review of Systems  Constitutional: Negative for appetite change and fatigue.  HENT: Negative for congestion, ear discharge and sinus pressure.   Eyes: Negative for discharge.  Respiratory: Negative for cough.   Cardiovascular: Negative for chest pain.  Gastrointestinal: Negative for abdominal pain and diarrhea.  Genitourinary: Negative for frequency and hematuria.  Musculoskeletal: Negative for back pain.  Skin: Negative for rash.  Neurological: Positive for dizziness and weakness. Negative for seizures and headaches.  Psychiatric/Behavioral:  Negative for hallucinations.     Physical Exam Updated Vital Signs LMP 10/17/2017   Physical Exam  Constitutional: She is oriented to person, place, and time. She appears well-developed.  HENT:  Head: Normocephalic.  Eyes: Conjunctivae and EOM are normal. No scleral icterus.  Neck: Neck supple. No thyromegaly present.  Cardiovascular: Normal rate and regular rhythm. Exam reveals no gallop and no friction rub.  No murmur heard. Pulmonary/Chest: No stridor. She has no wheezes. She has no rales. She exhibits no tenderness.  Abdominal: She exhibits no distension. There is no tenderness. There is no rebound.  Musculoskeletal: Normal range of motion. She exhibits no edema.  Lymphadenopathy:    She has no cervical adenopathy.  Neurological: She is oriented to person, place, and time. She exhibits normal muscle tone. Coordination normal.  Skin: No rash noted. No erythema.  Psychiatric: She has a normal mood and affect. Her behavior is normal.     ED Treatments / Results  Labs (all labs ordered are listed, but only abnormal results are displayed) Labs Reviewed - No data to display  EKG EKG Interpretation  Date/Time:  Thursday November 17 2017 20:43:20 EDT Ventricular Rate:  75 PR Interval:    QRS Duration: 97 QT Interval:  401 QTC Calculation: 448 R Axis:   83 Text Interpretation:  Sinus rhythm Low voltage, precordial leads No previous ECGs available Confirmed by Vanetta MuldersZackowski, Scott [phone]([id number]) on 11/17/2017 8:47:17 PM   Radiology No results found.  Procedures Procedures (including critical care time)  Medications Ordered in ED Medications  DULoxetine (CYMBALTA) DR capsule 30 mg (has no administration in time range)     Initial Impression / Assessment and Plan / ED Course  I have reviewed the triage vital signs and the nursing notes.  Pertinent labs & imaging results that were available during my care of the patient were reviewed by me and considered in my medical decision  making (see chart for details).    Patient was given a prescription of Cymbalta because she has been out of it.  She will follow-up with her PCP  Final Clinical Impressions(s) / ED Diagnoses   Final diagnoses:  Medication refill    ED Discharge Orders        Ordered    DULoxetine (CYMBALTA) 30 MG capsule  Daily     11/17/17 2059       Bethann BerkshireZammit, Jonea Bukowski, MD 11/18/17 1359

## 2017-11-17 NOTE — Discharge Instructions (Addendum)
Get a family doctor to follow up in the next month

## 2017-11-17 NOTE — ED Provider Notes (Signed)
[facility] EMERGENCY DEPARTMENT Provider Note   CSN: [id number] Arrival date & time: 11/17/17  2024     History   Chief Complaint Chief Complaint  Patient presents with  . Medication Refill    HPI Jennifer Greer is a 47 y.o. female.  Patient states that she is having some dizziness because she ran out of her Cymbalta a few days ago.-  The history is provided by the patient. No language interpreter was used.  Dizziness  Quality:  Lightheadedness Severity:  Moderate Onset quality:  Sudden Timing:  Intermittent Progression:  Waxing and waning Chronicity:  New Context: not when bending over   Associated symptoms: no chest pain, no diarrhea and no headaches     Past Medical History:  Diagnosis Date  . Anemia   . Anxiety   . Depression   . Fatty liver   . Mental disorder   . Panic attack   . Substance abuse [facility])     Patient Active Problem List   Diagnosis Date Noted  . Depression, recurrent (HCC) 07/15/2016  . Panic disorder 07/15/2016  . Fatty liver 07/15/2016  . Benzodiazepine dependence (HCC) 07/15/2016  . Psoriasis 07/15/2016  . Tobacco abuse 07/15/2016    Past Surgical History:  Procedure Laterality Date  . BACK SURGERY    . rotar cuff repair    . SPINE SURGERY    . TUBAL LIGATION       OB History    Gravida  5   Para  3   Term  3   Preterm      AB  2   Living  3     SAB      TAB      Ectopic      Multiple      Live Births  3            Home Medications    Prior to Admission medications   Medication Sig Start Date End Date Taking? Authorizing Provider  clonazePAM (KLONOPIN) 1 MG tablet Take 1 tablet (1 mg total) by mouth 2 (two) times daily. 12/08/16   Neysa Hotter, MD  DULoxetine (CYMBALTA) 30 MG capsule Take 1 capsule (30 mg total) by mouth daily. 11/17/17   Bethann Berkshire, MD  ibuprofen (ADVIL,MOTRIN) 800 MG tablet Take 1 tablet (800 mg total) by mouth 3 (three) times daily. 11/26/16   Triplett, Tammy, PA-C  Iron-FA-B  Cmp-C-Biot-Probiotic (FUSION PLUS) CAPS Take 1 capsule by mouth daily. 07/15/16   Eustace Moore, MD    Family History Family History  Problem Relation Age of Onset  . Emphysema Paternal Grandfather   . Emphysema Paternal Grandmother   . Breast cancer Maternal Grandmother   . Leukemia Maternal Grandmother   . Diabetes Maternal Grandmother   . Hypertension Maternal Grandmother   . Stroke Maternal Grandmother   . Heart attack Maternal Grandmother   . Cancer Maternal Grandfather        brain  . Panic disorder Father   . Hypertension Father   . Stroke Father   . Diabetes Father   . Other Father        heart issues  . Heart disease Father   . Hyperlipidemia Father   . Panic disorder Mother   . Hypertension Mother   . Anxiety disorder Sister   . Panic disorder Sister   . Hypertension Son     Social History Social History   Tobacco Use  . Smoking status: Current Every Day  Smoker    Packs/day: 0.50    Years: 15.00    Pack years: 7.50    Types: Cigarettes    Start date: 06/14/1984  . Smokeless tobacco: Never Used  . Tobacco comment: DISCUSSED  Substance Use Topics  . Alcohol use: Yes    Comment: occasionally  . Drug use: No     Allergies   Patient has no known allergies.   Review of Systems Review of Systems  Constitutional: Negative for appetite change and fatigue.  HENT: Negative for congestion, ear discharge and sinus pressure.   Eyes: Negative for discharge.  Respiratory: Negative for cough.   Cardiovascular: Negative for chest pain.  Gastrointestinal: Negative for abdominal pain and diarrhea.  Genitourinary: Negative for frequency and hematuria.  Musculoskeletal: Negative for back pain.  Skin: Negative for rash.  Neurological: Positive for dizziness. Negative for seizures and headaches.  Psychiatric/Behavioral: Negative for hallucinations.     Physical Exam Updated Vital Signs LMP 10/17/2017   Physical Exam  Constitutional: She is oriented to  person, place, and time. She appears well-developed.  HENT:  Head: Normocephalic.  Eyes: Conjunctivae and EOM are normal. No scleral icterus.  Neck: Neck supple. No thyromegaly present.  Cardiovascular: Normal rate and regular rhythm. Exam reveals no gallop and no friction rub.  No murmur heard. Pulmonary/Chest: No stridor. She has no wheezes. She has no rales. She exhibits no tenderness.  Abdominal: She exhibits no distension. There is no tenderness. There is no rebound.  Musculoskeletal: Normal range of motion. She exhibits no edema.  Lymphadenopathy:    She has no cervical adenopathy.  Neurological: She is oriented to person, place, and time. She exhibits normal muscle tone. Coordination normal.  Skin: No rash noted. No erythema.  Psychiatric: She has a normal mood and affect. Her behavior is normal.     ED Treatments / Results  Labs (all labs ordered are listed, but only abnormal results are displayed) Labs Reviewed - No data to display  EKG EKG Interpretation  Date/Time:  Thursday November 17 2017 20:43:20 EDT Ventricular Rate:  75 PR Interval:    QRS Duration: 97 QT Interval:  401 QTC Calculation: 448 R Axis:   83 Text Interpretation:  Sinus rhythm Low voltage, precordial leads No previous ECGs available Confirmed by Vanetta MuldersZackowski, Scott [phone]([id number]) on 11/17/2017 8:47:17 PM   Radiology No results found.  Procedures Procedures (including critical care time)  Medications Ordered in ED Medications  DULoxetine (CYMBALTA) DR capsule 30 mg (has no administration in time range)     Initial Impression / Assessment and Plan / ED Course  I have reviewed the triage vital signs and the nursing notes.  Pertinent labs & imaging results that were available during my care of the patient were reviewed by me and considered in my medical decision making (see chart for details).     Patient has not had her Cymbalta for 6 days and has some dizziness from not taking it.  We will refill her  Cymbalta today  Final Clinical Impressions(s) / ED Diagnoses   Final diagnoses:  Medication refill    ED Discharge Orders        Ordered    DULoxetine (CYMBALTA) 30 MG capsule  Daily     11/17/17 2059       Bethann BerkshireZammit, Brittnye Josephs, MD 11/17/17 2104

## 2017-11-17 NOTE — ED Triage Notes (Signed)
Pt states that she ran out of her Cymbalta a few days ago, started having palpitation, hot flashes, dizziness that became worse today,

## 2017-11-21 IMAGING — DX DG ANKLE COMPLETE 3+V*L*
3 series · 3 of 3 positions shown · non-contrast
Comparison: None.

CLINICAL DATA: Left ankle injury with swelling.  Initial encounter.

EXAM:
LEFT ANKLE COMPLETE - 3+ VIEW

[ankle ap]
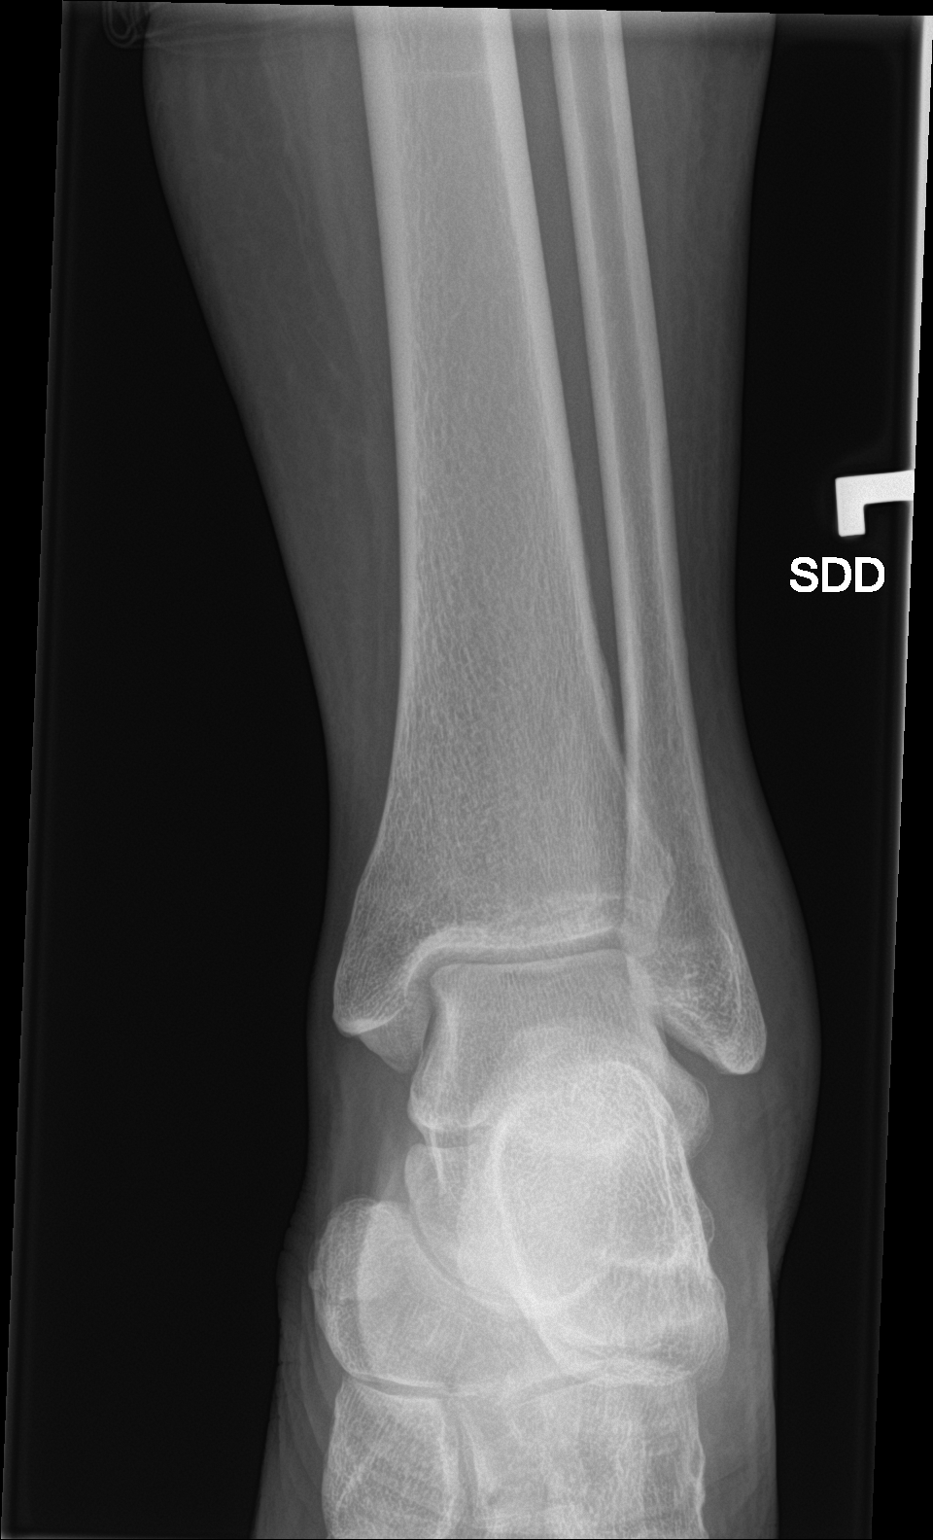

[ankle obl]
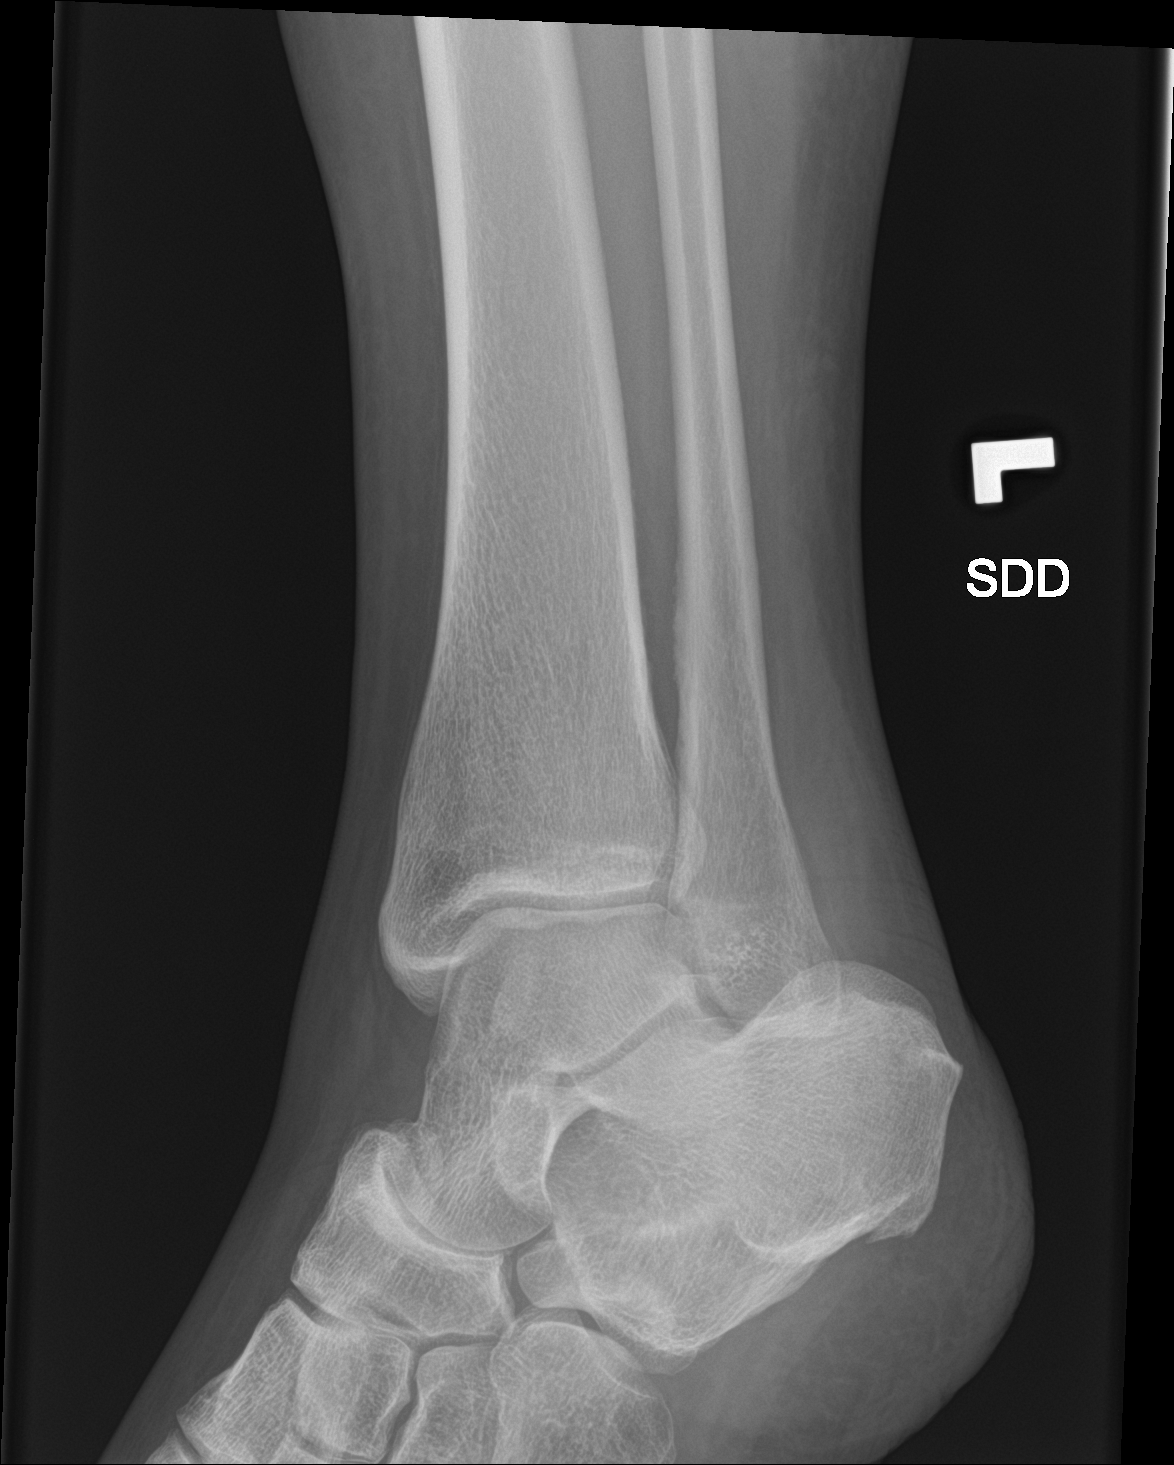

[ankle lat]
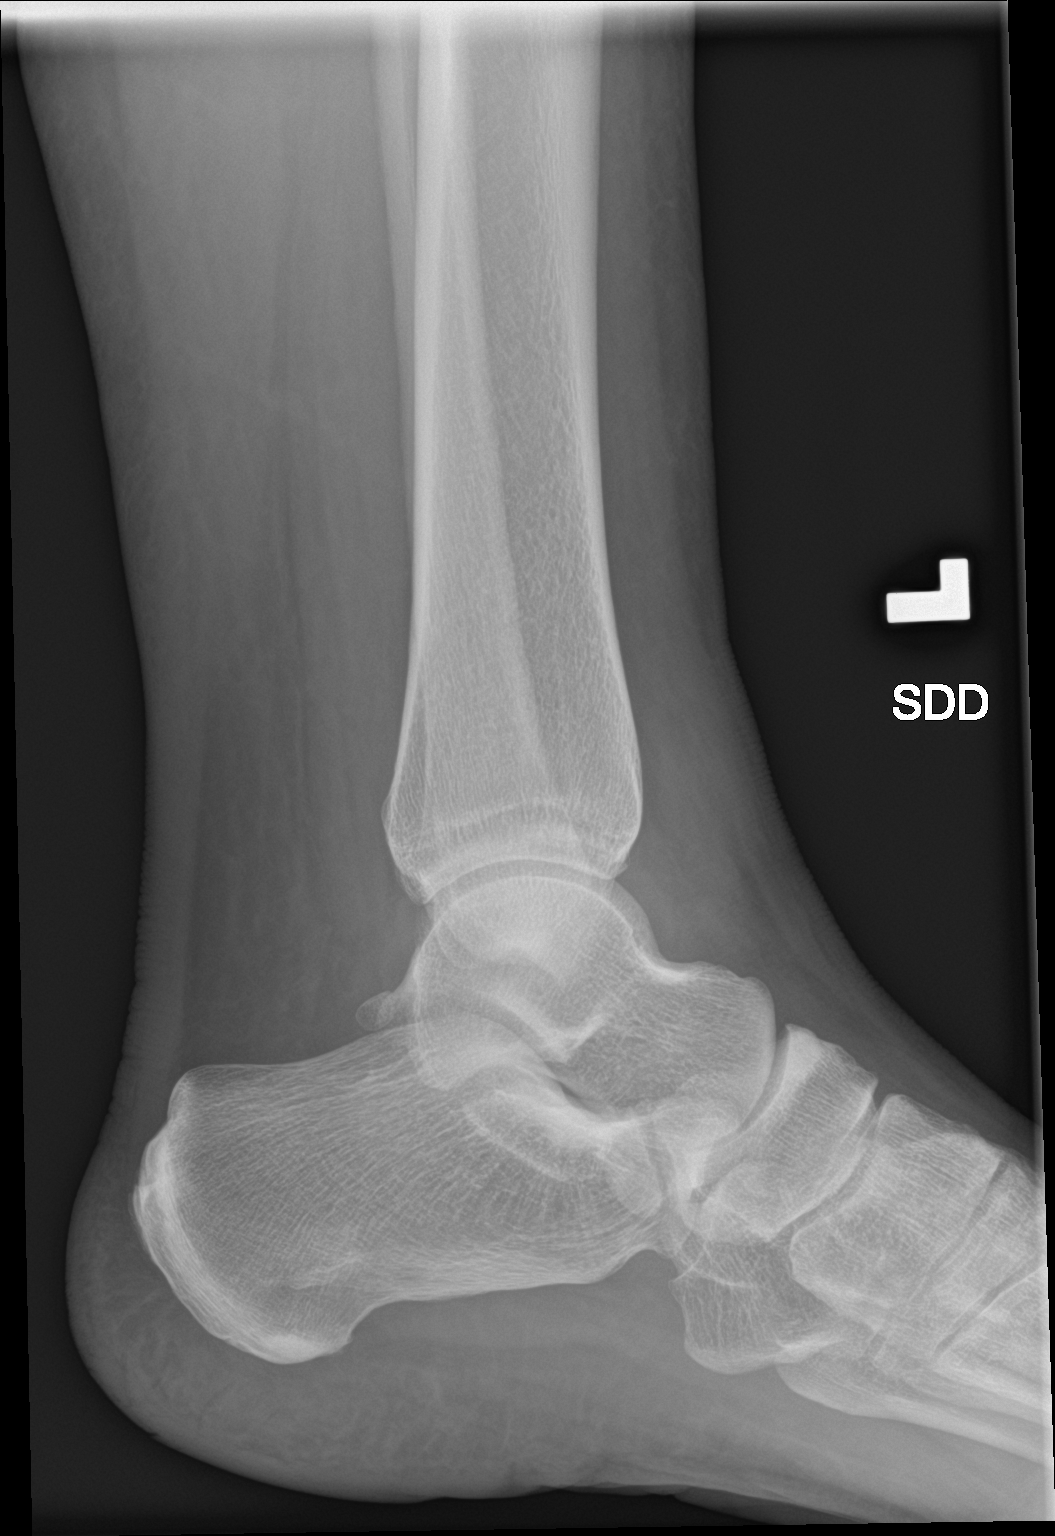

[3 of 3 positions shown; findings below may reference images not displayed]

FINDINGS: Lateral soft tissue swelling. Negative for acute fracture or
malalignment. Equivocal for small ankle joint effusion.
IMPRESSION: Soft tissue swelling without fracture.

## 2018-01-02 ENCOUNTER — Telehealth: Payer: Self-pay | Admitting: Obstetrics & Gynecology

## 2018-01-02 NOTE — Telephone Encounter (Signed)
Patient states she has been bleeding for 3 weeks and would like to be seen today instead of tomorrow if possible.  Informed patient we did not have any openings for today and since she already has scheduled appt for tomorrow, to keep appt.  Informed Hgb and U/S may be ordered at that time to assess bleeding. Verbalized understanding and ok with waiting.

## 2018-01-03 ENCOUNTER — Encounter: Payer: Self-pay | Admitting: Adult Health

## 2018-01-03 ENCOUNTER — Ambulatory Visit (INDEPENDENT_AMBULATORY_CARE_PROVIDER_SITE_OTHER): Payer: BLUE CROSS/BLUE SHIELD | Admitting: Adult Health

## 2018-01-03 VITALS — BP 118/74 | HR 93 | Ht 68.25 in | Wt 156.5 lb

## 2018-01-03 DIAGNOSIS — Z1212 Encounter for screening for malignant neoplasm of rectum: Secondary | ICD-10-CM

## 2018-01-03 DIAGNOSIS — N938 Other specified abnormal uterine and vaginal bleeding: Secondary | ICD-10-CM | POA: Diagnosis not present

## 2018-01-03 DIAGNOSIS — D649 Anemia, unspecified: Secondary | ICD-10-CM

## 2018-01-03 DIAGNOSIS — Z01419 Encounter for gynecological examination (general) (routine) without abnormal findings: Secondary | ICD-10-CM | POA: Diagnosis not present

## 2018-01-03 DIAGNOSIS — Z1211 Encounter for screening for malignant neoplasm of colon: Secondary | ICD-10-CM | POA: Insufficient documentation

## 2018-01-03 LAB — POCT HEMOGLOBIN: Hemoglobin: 8.7 g/dL — AB (ref 12.2–16.2)

## 2018-01-03 MED ORDER — FUSION PLUS PO CAPS: 1.0000 | ORAL_CAPSULE | Freq: Every day | ORAL | 3 refills | Status: DC

## 2018-01-03 MED ORDER — MEGESTROL ACETATE 40 MG PO TABS: ORAL_TABLET | ORAL | 1 refills | Status: DC

## 2018-01-03 NOTE — Progress Notes (Signed)
Patient ID: Jennifer LoftSusan R Greer, female   DOB: 1970-08-03, 47 y.o.   MRN: [id number] History of Present Illness: Jennifer PikesSusan is a 47 year old white female in for well woman gyn exam, she had normal pap with negative HPV 04/06/16. She complains of being tired and bleeding this whole month. No PCP.    Current Medications, Allergies, Past Medical History, Past Surgical History, Family History and Social History were reviewed in [name] electronic medical record.     Review of Systems: Patient denies any headaches, hearing loss, blurred vision, shortness of breath, chest pain, abdominal pain, problems with bowel movements, urination, or intercourse(not currently active). No joint pain or mood swings. +bleeding since first of July,skipped June had normal period in May. +tired   Physical Exam:BP 118/74 (BP Location: Left Arm, Patient Position: Sitting, Cuff Size: Normal)   Pulse 93   Ht 5' 8.25" (1.734 m)   Wt 156 lb 8 oz (71 kg)   LMP 12/12/2017 Comment: ongoing bleeding  BMI 23.62 kg/m HGB 8.7. General:  Well developed, well nourished, no acute distress, but looks like she feels bad Skin:  Warm and dry,pale,+psorias on elbows and under breasts Neck:  Midline trachea, normal thyroid, good ROM, no lymphadenopathy Lungs; Clear to auscultation bilaterally Breast:  No dominant palpable mass, retraction, or nipple discharge Cardiovascular: Regular rate and rhythm Abdomen:  Soft, non tender, no hepatosplenomegaly Pelvic:  External genitalia is normal in appearance, no lesions.  The vagina is normal in appearance,+blood and clots. Urethra has no lesions or masses. The cervix is bulbous.No CMT.  Uterus is felt to be normal size, shape, and contour.  No adnexal masses or tenderness noted.Bladder is non tender, no masses felt. Rectal: Deferred due to bleeding Extremities/musculoskeletal:  No swelling or varicosities noted, no clubbing or cyanosis Psych:  No mood changes, alert and cooperative,seems  happy PHQ 9 score 15, is on meds and denies being suicidal.  Will give megace to stop bleeding and get US and labs, take iron.   Impression: 1. Encounter for well woman exam with routine gynecological exam   2. DUB (dysfunctional uterine bleeding)   3. Anemia, unspecified type       Plan: Check CBC with diff,CMP,TSH and ferritin [facility] at 1:30  Meds ordered this encounter  Medications  . Iron-FA-B Cmp-C-Biot-Probiotic (FUSION PLUS) CAPS    Sig: Take 1 capsule by mouth daily.    Dispense:  100 capsule    Refill:  3    Order Specific Question:   Supervising Provider    Answer:   EURE, LUTHER H [[id number]]  . megestrol (MEGACE) 40 MG tablet    Sig: Take 3 for 5 days then 2 for 5 days then 1 daily    Dispense:  45 tablet    Refill:  1    Order Specific Question:   Supervising Provider    Answer:   Lazaro ArmsEURE, LUTHER H [[id number]]  Note given to return to work 7/30 F/U 7/29 with me  Physical in 1 year with Pap Get mammogram in near future

## 2018-01-04 ENCOUNTER — Telehealth: Payer: Self-pay | Admitting: Adult Health

## 2018-01-04 ENCOUNTER — Encounter: Payer: Self-pay | Admitting: Adult Health

## 2018-01-04 DIAGNOSIS — R790 Abnormal level of blood mineral: Secondary | ICD-10-CM | POA: Insufficient documentation

## 2018-01-04 LAB — COMPREHENSIVE METABOLIC PANEL
A/G RATIO: 1.6 (ref 1.2–2.2)
ALBUMIN: 4.3 g/dL (ref 3.5–5.5)
ALK PHOS: 43 IU/L (ref 39–117)
ALT: 7 IU/L (ref 0–32)
AST: 11 IU/L (ref 0–40)
BUN / CREAT RATIO: 12 (ref 9–23)
BUN: 8 mg/dL (ref 6–24)
Bilirubin Total: <0.2 mg/dL (ref 0.0–1.2)
CHLORIDE: 101 mmol/L (ref 96–106)
CO2: 23 mmol/L (ref 20–29)
Calcium: 8.8 mg/dL (ref 8.7–10.2)
Creatinine, Ser: 0.69 mg/dL (ref 0.57–1.00)
GFR calc non Af Amer: 103 mL/min/{1.73_m2} (ref >59)
GFR, EST AFRICAN AMERICAN: 119 mL/min/{1.73_m2} (ref >59)
GLOBULIN, TOTAL: 2.7 g/dL (ref 1.5–4.5)
GLUCOSE: 90 mg/dL (ref 65–99)
POTASSIUM: 4.5 mmol/L (ref 3.5–5.2)
SODIUM: 137 mmol/L (ref 134–144)
TOTAL PROTEIN: 7 g/dL (ref 6.0–8.5)

## 2018-01-04 LAB — CBC WITH DIFFERENTIAL/PLATELET
BASOS: 1 %
Basophils Absolute: 0.1 10*3/uL (ref 0.0–0.2)
EOS (ABSOLUTE): 0.1 10*3/uL (ref 0.0–0.4)
EOS: 3 %
HEMATOCRIT: 34 % (ref 34.0–46.6)
HEMOGLOBIN: 9.5 g/dL — AB (ref 11.1–15.9)
Immature Grans (Abs): 0 10*3/uL (ref 0.0–0.1)
Immature Granulocytes: 0 %
Lymphocytes Absolute: 1.2 10*3/uL (ref 0.7–3.1)
Lymphs: 25 %
MCH: 18.9 pg — ABNORMAL LOW (ref 26.6–33.0)
MCHC: 27.9 g/dL — AB (ref 31.5–35.7)
MCV: 68 fL — AB (ref 79–97)
MONOCYTES: 9 %
MONOS ABS: 0.5 10*3/uL (ref 0.1–0.9)
Neutrophils Absolute: 3 10*3/uL (ref 1.4–7.0)
Neutrophils: 62 %
Platelets: 426 10*3/uL (ref 150–450)
RBC: 5.02 x10E6/uL (ref 3.77–5.28)
RDW: 20 % — ABNORMAL HIGH (ref 12.3–15.4)
WBC: 4.9 10*3/uL (ref 3.4–10.8)

## 2018-01-04 LAB — TSH: TSH: 2.5 u[IU]/mL (ref 0.450–4.500)

## 2018-01-04 LAB — FERRITIN: Ferritin: 3 ng/mL — ABNORMAL LOW (ref 15–150)

## 2018-01-04 NOTE — Telephone Encounter (Signed)
Pt aware of labs, and bleeding has slowed, has US tomorrow, she is aware may need iron infusion.Kepp appt next week

## 2018-01-05 ENCOUNTER — Telehealth: Payer: Self-pay | Admitting: Adult Health

## 2018-01-05 ENCOUNTER — Ambulatory Visit (HOSPITAL_COMMUNITY)
Admission: RE | Admit: 2018-01-05 | Discharge: 2018-01-05 | Disposition: A | Discharge status: Home / Self Care | Payer: BLUE CROSS/BLUE SHIELD | Source: Ambulatory Visit | Attending: Adult Health | Admitting: Adult Health

## 2018-01-05 DIAGNOSIS — N938 Other specified abnormal uterine and vaginal bleeding: Secondary | ICD-10-CM

## 2018-01-05 NOTE — Telephone Encounter (Signed)
Pt aware that US showed normal uterus and ovaries, endometrium is thickened at 27 mm, is no megace now to stop bleeding, and it did stop, then had gush of blood, still taking megace, and has F/U appt Monday.

## 2018-01-09 ENCOUNTER — Encounter: Payer: Self-pay | Admitting: Adult Health

## 2018-01-09 ENCOUNTER — Ambulatory Visit: Payer: BLUE CROSS/BLUE SHIELD | Admitting: Adult Health

## 2018-01-09 VITALS — BP 133/83 | HR 100 | Ht 68.2 in | Wt 160.4 lb

## 2018-01-09 DIAGNOSIS — N938 Other specified abnormal uterine and vaginal bleeding: Secondary | ICD-10-CM

## 2018-01-09 DIAGNOSIS — R790 Abnormal level of blood mineral: Secondary | ICD-10-CM

## 2018-01-09 DIAGNOSIS — D500 Iron deficiency anemia secondary to blood loss (chronic): Secondary | ICD-10-CM | POA: Insufficient documentation

## 2018-01-09 MED ORDER — DULOXETINE HCL 30 MG PO CPEP: 30.0000 mg | ORAL_CAPSULE | Freq: Every day | ORAL | 3 refills | Status: DC

## 2018-01-09 NOTE — Progress Notes (Signed)
  Subjective:     Patient ID: Jennifer Greer, female   DOB: [birth year], 47 y.o.   MRN: [id number]  HPI Jennifer Greer is a 47 year old white female, back in follow up for bleeding. And she requests refill on Cymbalta. The bleeding has stopped.  Review of Systems Bleeding has stopped +night sweats and hot flashes Reviewed past medical,surgical, social and family history. Reviewed medications and allergies.     Objective:   Physical Exam BP 133/83 (BP Location: Left Arm, Patient Position: Sitting, Cuff Size: Small)   Pulse 100   Ht 5' 8.2" (1.732 m)   Wt 160 lb 6.4 oz (72.8 kg)   LMP 12/12/2017 Comment: ongoing bleeding  BMI 24.25 kg/m Talk only:bleeding stopped Saturday, discussed ablation as option and will make appt with Dr Despina HiddenEure.And he may give iron infusion while at hospital.    Assessment:     1. DUB (dysfunctional uterine bleeding)   2. Iron deficiency anemia due to chronic blood loss   3. Low serum ferritin level       Plan:     Continue iron and megace Meds ordered this encounter  Medications  . DULoxetine (CYMBALTA) 30 MG capsule    Sig: Take 1 capsule (30 mg total) by mouth daily.    Dispense:  30 capsule    Refill:  3    Order Specific Question:   Supervising Provider    Answer:   Lazaro ArmsEURE, LUTHER H [[id number]]  Review handout on ablation   Return in 1 week for pre op with Dr Despina HiddenEure

## 2018-01-16 ENCOUNTER — Encounter: Payer: BLUE CROSS/BLUE SHIELD | Admitting: Obstetrics & Gynecology

## 2018-01-16 ENCOUNTER — Telehealth: Payer: Self-pay | Admitting: Adult Health

## 2018-01-16 NOTE — Telephone Encounter (Signed)
Pt called stating that she needed a note for work. She states that her employer doesn't want her to come back until after she has surgery because of the amount of labor that is involved. Pt was supposed to be seen today for her pre op but had to be rescheduled d/t provider issues. Advised that we would provide her with a note for work this week and she could discuss how long to be out of work when she is seen for her pre op appt on Thursday. Pt verbalized understanding.

## 2018-01-16 NOTE — Telephone Encounter (Signed)
Patient called stating that she needs a letter taking her out of work until august the 9th. Pt states that her Job wont let her return but she needs a note. Please contact pt

## 2018-01-19 ENCOUNTER — Other Ambulatory Visit: Payer: Self-pay

## 2018-01-19 ENCOUNTER — Ambulatory Visit: Payer: BLUE CROSS/BLUE SHIELD | Admitting: Obstetrics & Gynecology

## 2018-01-19 ENCOUNTER — Encounter: Payer: Self-pay | Admitting: Obstetrics & Gynecology

## 2018-01-19 VITALS — BP 124/81 | HR 92 | Ht 68.0 in | Wt 160.0 lb

## 2018-01-19 DIAGNOSIS — N920 Excessive and frequent menstruation with regular cycle: Secondary | ICD-10-CM | POA: Diagnosis not present

## 2018-01-19 DIAGNOSIS — D500 Iron deficiency anemia secondary to blood loss (chronic): Secondary | ICD-10-CM | POA: Diagnosis not present

## 2018-01-24 ENCOUNTER — Encounter: Payer: BLUE CROSS/BLUE SHIELD | Admitting: Obstetrics & Gynecology

## 2018-01-31 ENCOUNTER — Telehealth: Payer: Self-pay | Admitting: *Deleted

## 2018-01-31 NOTE — Telephone Encounter (Signed)
LMOM for pt to call regarding needing restrictions at work.

## 2018-01-31 NOTE — Telephone Encounter (Signed)
Called pt to clarify the need for a work note. Informed pt that her job is requesting a note for restrictions. Pt states that she is unsure of why. She states that she and her employer had worked out the hours for her upcoming work week. Advised pt to call employer and see what the issue is and to call us back if she felt she needed a note. She states that she is having headaches and wants to make an appt with Victorino DikeJennifer.

## 2018-02-03 ENCOUNTER — Encounter: Payer: Self-pay | Admitting: Obstetrics & Gynecology

## 2018-02-03 NOTE — Progress Notes (Signed)
Chief Complaint  Patient presents with  . Pre-op Exam      47 y.o. [id number] No LMP recorded. The current method of family planning is tubal ligation.  Outpatient Encounter Medications as of 01/19/2018  Medication Sig  . ALPRAZolam (XANAX) 1 MG tablet Take 1 mg by mouth 3 (three) times daily.  . DULoxetine (CYMBALTA) 30 MG capsule Take 1 capsule (30 mg total) by mouth daily.  Marland Kitchen ibuprofen (ADVIL,MOTRIN) 800 MG tablet Take 1 tablet (800 mg total) by mouth 3 (three) times daily. (Patient taking differently: Take 800 mg by mouth as needed. )  . Iron-FA-B Cmp-C-Biot-Probiotic (FUSION PLUS) CAPS Take 1 capsule by mouth daily.  . megestrol (MEGACE) 40 MG tablet Take 3 for 5 days then 2 for 5 days then 1 daily   No facility-administered encounter medications on file as of 01/19/2018.     Subjective Jennifer Greer with menometrorrhagia dysmenorrhea normal sonogram Managed well on megestrol Here to talk about ablation Discussed at length options including stating on the megestrol for now due to work and life issues She is still debating which to go with but leaning toward staying on megace for now and delaying the ablation for the future Past Medical History:  Diagnosis Date  . Anemia   . Anxiety   . Depression   . Fatty liver   . Fatty liver   . Mental disorder   . Panic attack   . Substance abuse [facility])     Past Surgical History:  Procedure Laterality Date  . BACK SURGERY    . rotar cuff repair    . SPINE SURGERY    . TUBAL LIGATION      OB History    Gravida  5   Para  3   Term  3   Preterm      AB  2   Living  3     SAB      TAB      Ectopic      Multiple      Live Births  3           No Known Allergies  Social History   Socioeconomic History  . Marital status: Legally Separated    Spouse name: Not on file  . Number of children: 3  . Years of education: 60  . Highest education level: Not on file  Occupational History  . Occupation:  HAIR STYLIST  Social Needs  . Financial resource strain: Not on file  . Food insecurity:    Worry: Not on file    Inability: Not on file  . Transportation needs:    Medical: Not on file    Non-medical: Not on file  Tobacco Use  . Smoking status: Current Every Day Smoker    Packs/day: 0.50    Years: 15.00    Pack years: 7.50    Types: Cigarettes    Start date: 06/14/1984  . Smokeless tobacco: Never Used  . Tobacco comment: DISCUSSED  Substance and Sexual Activity  . Alcohol use: Yes    Comment: occasionally  . Drug use: No  . Sexual activity: Not Currently    Birth control/protection: Surgical    Comment: tubal  Lifestyle  . Physical activity:    Days per week: Not on file    Minutes per session: Not on file  . Stress: Not on file  Relationships  . Social connections:    Talks on phone: Not on  file    Gets together: Not on file    Attends religious service: Not on file    Active member of club or organization: Not on file    Attends meetings of clubs or organizations: Not on file    Relationship status: Not on file  Other Topics Concern  . Not on file  Social History Narrative   [place]   Separated   Works as hairdresser    Family History  Problem Relation Age of Onset  . Emphysema Paternal Grandfather   . Emphysema Paternal Grandmother   . Breast cancer Maternal Grandmother   . Leukemia Maternal Grandmother   . Diabetes Maternal Grandmother   . Hypertension Maternal Grandmother   . Stroke Maternal Grandmother   . Heart attack Maternal Grandmother   . Cancer Maternal Grandfather        brain  . Panic disorder Father   . Hypertension Father   . Stroke Father   . Diabetes Father   . Other Father        heart issues  . Heart disease Father   . Hyperlipidemia Father   . Panic disorder Mother   . Hypertension Mother   . Anxiety disorder Sister   . Panic disorder Sister   . Hypertension Son     Medications:       Current  Outpatient Medications:  .  ALPRAZolam (XANAX) 1 MG tablet, Take 1 mg by mouth 3 (three) times daily., Disp: , Rfl:  .  DULoxetine (CYMBALTA) 30 MG capsule, Take 1 capsule (30 mg total) by mouth daily., Disp: 30 capsule, Rfl: 3 .  ibuprofen (ADVIL,MOTRIN) 800 MG tablet, Take 1 tablet (800 mg total) by mouth 3 (three) times daily. (Patient taking differently: Take 800 mg by mouth as needed. ), Disp: 21 tablet, Rfl: 0 .  Iron-FA-B Cmp-C-Biot-Probiotic (FUSION PLUS) CAPS, Take 1 capsule by mouth daily., Disp: 100 capsule, Rfl: 3 .  megestrol (MEGACE) 40 MG tablet, Take 3 for 5 days then 2 for 5 days then 1 daily, Disp: 45 tablet, Rfl: 1  Objective Blood pressure 124/81, pulse 92, height 5\' 8"  (1.727 m), weight 160 lb (72.6 kg).  Gen WDWN NAD  Pertinent ROS No burning with urination, frequency or urgency No nausea, vomiting or diarrhea Nor fever chills or other constitutional symptoms   Amenorrheic on megestrol  Labs or studies Previous studies have been reviewed    Impression Diagnoses this Encounter::   ICD-10-CM   1. Menorrhagia with regular cycle N92.0   2. Iron deficiency anemia due to chronic blood loss D50.0     Established relevant diagnosis(es):   Plan/Recommendations: No orders of the defined types were placed in this encounter.   Labs or Scans Ordered: No orders of the defined types were placed in this encounter.   Management:: Continue megestrol Ablation option discussed, has brochure  Will decide, work life issues to consider as well  "[place] [employer] is in her court"  Follow up Return if symptoms worsen or fail to improve.        Face to face time:  15 minutes  Greater than 50% of the visit time was spent in counseling and coordination of care with the patient.  The summary and outline of the counseling and care coordination is summarized in the note above.   All questions were answered.

## 2018-02-08 ENCOUNTER — Encounter: Payer: Self-pay | Admitting: Obstetrics & Gynecology

## 2018-02-08 ENCOUNTER — Ambulatory Visit: Payer: BLUE CROSS/BLUE SHIELD | Admitting: Adult Health

## 2018-03-01 ENCOUNTER — Other Ambulatory Visit: Payer: Self-pay | Admitting: Adult Health

## 2018-03-07 ENCOUNTER — Ambulatory Visit: Payer: BLUE CROSS/BLUE SHIELD | Admitting: Adult Health

## 2018-03-07 ENCOUNTER — Encounter: Payer: Self-pay | Admitting: Adult Health

## 2018-03-07 VITALS — BP 110/72 | HR 69 | Ht 69.0 in | Wt 163.0 lb

## 2018-03-07 DIAGNOSIS — N938 Other specified abnormal uterine and vaginal bleeding: Secondary | ICD-10-CM

## 2018-03-07 DIAGNOSIS — N920 Excessive and frequent menstruation with regular cycle: Secondary | ICD-10-CM | POA: Diagnosis not present

## 2018-03-07 LAB — POCT HEMOGLOBIN: HEMOGLOBIN: 13.6 g/dL (ref 12.2–16.2)

## 2018-03-07 NOTE — Progress Notes (Signed)
  Subjective:     Patient ID: Jennifer Greer, female   DOB: 06/30/1970, 47 y.o.   MRN: [id number]  HPI Jennifer Greer is a 47 year old white female in complaining of bleeding heavy again, and headache, has taken 2 megace. Craves ice. She is ready to get ablation now.   Review of Systems +heavy bleeding since yesterday and headache +craves ice Reviewed past medical,surgical, social and family history. Reviewed medications and allergies.     Objective:   Physical Exam BP 110/72 (BP Location: Left Arm, Patient Position: Sitting, Cuff Size: Normal)   Pulse 69   Ht 5\' 9"  (1.753 m)   Wt 163 lb (73.9 kg)   BMI 24.07 kg/m   HGB fingerstick 13.6 Skin warm and dry. Lungs: clear to ausculation bilaterally. Cardiovascular: regular rate and rhythm. No facial tenderness     Assessment:     1. DUB (dysfunctional uterine bleeding)   2. Menorrhagia with regular cycle       Plan:     Take 3 megace every day Take iron Increase fluids Return 10/4 for pre op with Dr Despina HiddenEure Not given to return to work 9/26 and bring FMLA papers in

## 2018-03-07 NOTE — Patient Instructions (Signed)
Take 3 megace daily

## 2018-03-17 ENCOUNTER — Encounter: Payer: Self-pay | Admitting: Obstetrics & Gynecology

## 2018-03-17 ENCOUNTER — Other Ambulatory Visit: Payer: Self-pay

## 2018-03-17 ENCOUNTER — Ambulatory Visit: Payer: BLUE CROSS/BLUE SHIELD | Admitting: Obstetrics & Gynecology

## 2018-03-17 VITALS — BP 112/72 | HR 73 | Ht 68.0 in | Wt 165.0 lb

## 2018-03-17 DIAGNOSIS — D500 Iron deficiency anemia secondary to blood loss (chronic): Secondary | ICD-10-CM | POA: Diagnosis not present

## 2018-03-17 DIAGNOSIS — N920 Excessive and frequent menstruation with regular cycle: Secondary | ICD-10-CM

## 2018-03-20 ENCOUNTER — Encounter: Payer: Self-pay | Admitting: Obstetrics & Gynecology

## 2018-03-20 NOTE — Progress Notes (Signed)
Preoperative History and Physical  Jennifer Greer is a 47 y.o. [id number] with No LMP recorded. admitted for a hsyteroscopy uterine curettage endometrial ablation for menometrorrhagia dysmenorrhea.  No dyspareunia or pelvic pain chronically  PMH:    Past Medical History:  Diagnosis Date  . Anemia   . Anxiety   . Depression   . Fatty liver   . Fatty liver   . Mental disorder   . Panic attack   . Substance abuse (HCC)     PSH:     Past Surgical History:  Procedure Laterality Date  . BACK SURGERY    . rotar cuff repair    . SPINE SURGERY    . TUBAL LIGATION      POb/GynH:      OB History    Gravida  5   Para  3   Term  3   Preterm      AB  2   Living  3     SAB      TAB      Ectopic      Multiple      Live Births  3           SH:   Social History   Tobacco Use  . Smoking status: Former Smoker    Packs/day: 0.50    Years: 15.00    Pack years: 7.50    Types: Cigarettes    Start date: 06/14/1984  . Smokeless tobacco: Never Used  . Tobacco comment: DISCUSSED  Substance Use Topics  . Alcohol use: Yes    Comment: occasionally  . Drug use: No    FH:    Family History  Problem Relation Age of Onset  . Emphysema Paternal Grandfather   . Emphysema Paternal Grandmother   . Breast cancer Maternal Grandmother   . Leukemia Maternal Grandmother   . Diabetes Maternal Grandmother   . Hypertension Maternal Grandmother   . Stroke Maternal Grandmother   . Heart attack Maternal Grandmother   . Cancer Maternal Grandfather        brain  . Panic disorder Father   . Hypertension Father   . Stroke Father   . Diabetes Father   . Other Father        heart issues  . Heart disease Father   . Hyperlipidemia Father   . Panic disorder Mother   . Hypertension Mother   . Anxiety disorder Sister   . Panic disorder Sister   . Hypertension Son      Allergies: No Known Allergies  Medications:       Current Outpatient Medications:  .  ALPRAZolam (XANAX) 1  MG tablet, Take 1 mg by mouth 3 (three) times daily., Disp: , Rfl:  .  DULoxetine (CYMBALTA) 30 MG capsule, Take 1 capsule (30 mg total) by mouth daily., Disp: 30 capsule, Rfl: 3 .  ibuprofen (ADVIL,MOTRIN) 800 MG tablet, Take 1 tablet (800 mg total) by mouth 3 (three) times daily. (Patient taking differently: Take 800 mg by mouth as needed. ), Disp: 21 tablet, Rfl: 0 .  Iron-FA-B Cmp-C-Biot-Probiotic (FUSION PLUS) CAPS, Take 1 capsule by mouth daily., Disp: 100 capsule, Rfl: 3 .  megestrol (MEGACE) 40 MG tablet, TAKE 3 FOR 5 DAYS THEN 2 FOR 5 DAYS THEN 1 DAILY, Disp: 45 tablet, Rfl: 1  Review of Systems:   Review of Systems  Constitutional: Negative for fever, chills, weight loss, malaise/fatigue and diaphoresis.  HENT: Negative for hearing loss, ear pain,  nosebleeds, congestion, sore throat, neck pain, tinnitus and ear discharge.   Eyes: Negative for blurred vision, double vision, photophobia, pain, discharge and redness.  Respiratory: Negative for cough, hemoptysis, sputum production, shortness of breath, wheezing and stridor.   Cardiovascular: Negative for chest pain, palpitations, orthopnea, claudication, leg swelling and PND.  Gastrointestinal: Positive for abdominal pain. Negative for heartburn, nausea, vomiting, diarrhea, constipation, blood in stool and melena.  Genitourinary: Negative for dysuria, urgency, frequency, hematuria and flank pain.  Musculoskeletal: Negative for myalgias, back pain, joint pain and falls.  Skin: Negative for itching and rash.  Neurological: Negative for dizziness, tingling, tremors, sensory change, speech change, focal weakness, seizures, loss of consciousness, weakness and headaches.  Endo/Heme/Allergies: Negative for environmental allergies and polydipsia. Does not bruise/bleed easily.  Psychiatric/Behavioral: Negative for depression, suicidal ideas, hallucinations, memory loss and substance abuse. The patient is not nervous/anxious and does not have  insomnia.      PHYSICAL EXAM:  Blood pressure 112/72, pulse 73, height 5\' 8"  (1.727 m), weight 165 lb (74.8 kg).    Vitals reviewed. Constitutional: She is oriented to person, place, and time. She appears well-developed and well-nourished.  HENT:  Head: Normocephalic and atraumatic.  Right Ear: External ear normal.  Left Ear: External ear normal.  Nose: Nose normal.  Mouth/Throat: Oropharynx is clear and moist.  Eyes: Conjunctivae and EOM are normal. Pupils are equal, round, and reactive to light. Right eye exhibits no discharge. Left eye exhibits no discharge. No scleral icterus.  Neck: Normal range of motion. Neck supple. No tracheal deviation present. No thyromegaly present.  Cardiovascular: Normal rate, regular rhythm, normal heart sounds and intact distal pulses.  Exam reveals no gallop and no friction rub.   No murmur heard. Respiratory: Effort normal and breath sounds normal. No respiratory distress. She has no wheezes. She has no rales. She exhibits no tenderness.  GI: Soft. Bowel sounds are normal. She exhibits no distension and no mass. There is tenderness. There is no rebound and no guarding.  Genitourinary:       Vulva is normal without lesions Vagina is pink moist without discharge Cervix normal in appearance and pap is normal Uterus is normal size, contour, position, consistency, mobility, non-tender Adnexa is negative with normal sized ovaries by sonogram  Musculoskeletal: Normal range of motion. She exhibits no edema and no tenderness.  Neurological: She is alert and oriented to person, place, and time. She has normal reflexes. She displays normal reflexes. No cranial nerve deficit. She exhibits normal muscle tone. Coordination normal.  Skin: Skin is warm and dry. No rash noted. No erythema. No pallor.  Psychiatric: She has a normal mood and affect. Her behavior is normal. Judgment and thought content normal.    Labs: Results for orders placed or performed in visit  on 03/07/18 (from the past 336 hour(s))  POCT hemoglobin   Collection Time: 03/07/18  3:21 PM  Result Value Ref Range   Hemoglobin 13.6 12.2 - 16.2 g/dL    EKG: Orders placed or performed during the hospital encounter of 11/17/17  . ED EKG  . ED EKG  . EKG 12-Lead  . EKG 12-Lead  . EKG    Imaging Studies: No results found.    Assessment: Menorrhagia with regular cycle  Iron deficiency anemia due to chronic blood loss    Patient Active Problem List   Diagnosis Date Noted  . Menorrhagia with regular cycle 03/07/2018  . Iron deficiency anemia due to chronic blood loss 01/09/2018  . Low serum  ferritin level 01/04/2018  . Encounter for well woman exam with routine gynecological exam 01/03/2018  . DUB (dysfunctional uterine bleeding) 01/03/2018  . Anemia 01/03/2018  . Screening for colorectal cancer 01/03/2018  . Depression, recurrent (HCC) 07/15/2016  . Panic disorder 07/15/2016  . Fatty liver 07/15/2016  . Benzodiazepine dependence (HCC) 07/15/2016  . Psoriasis 07/15/2016  . Tobacco abuse 07/15/2016    Plan: Hysteroscopy uterine curettage [facility] endometrial ablation 04/05/2018  Lazaro Arms 03/20/2018 10:53 AM      Face to face time:  15 minutes  Greater than 50% of the visit time was spent in counseling and coordination of care with the patient.  The summary and outline of the counseling and care coordination is summarized in the note above.   All questions were answered.

## 2018-03-27 NOTE — Patient Instructions (Signed)
Jennifer Greer  03/27/2018     @PREFPERIOPPHARMACY @   Your procedure is scheduled on  04/05/2018   Report to [facility] at  1100   A.M.  Call this number if you have problems the morning of surgery:  [phone]   Remember:  Do not eat or drink after midnight.                        Take these medicines the morning of surgery with A SIP OF WATER xanax ( if needed), cymbalta.    Do not wear jewelry, make-up or nail polish.  Do not wear lotions, powders, or perfumes, or deodorant.  Do not shave 48 hours prior to surgery.  Men may shave face and neck.  Do not bring valuables to the hospital.  [facility] is not responsible for any belongings or valuables.  Contacts, dentures or bridgework may not be worn into surgery.  Leave your suitcase in the car.  After surgery it may be brought to your room.  For patients admitted to the hospital, discharge time will be determined by your treatment team.  Patients discharged the day of surgery will not be allowed to drive home.   Name and phone number of your driver:   family Special instructions:  None  Please read over the following fact sheets that you were given. Anesthesia Post-op Instructions and Care and Recovery After Surgery       Dilation and Curettage or Vacuum Curettage Dilation and curettage (D&C) and vacuum curettage are minor procedures. A D&C involves stretching (dilation) the cervix and scraping (curettage) the inside lining of the uterus (endometrium). During a D&C, tissue is gently scraped from the endometrium, starting from the top portion of the uterus down to the lowest part of the uterus (cervix). During a vacuum curettage, the lining and tissue in the uterus are removed with the use of gentle suction. Curettage may be performed to either diagnose or treat a problem. As a diagnostic procedure, curettage is performed to examine tissues from the uterus. A diagnostic curettage may be done  if you have:  Irregular bleeding in the uterus.  Bleeding with the development of clots.  Spotting between menstrual periods.  Prolonged menstrual periods or other abnormal bleeding.  Bleeding after menopause.  No menstrual period (amenorrhea).  A change in size and shape of the uterus.  Abnormal endometrial cells discovered during a Pap test.  As a treatment procedure, curettage may be performed for the following reasons:  Removal of an IUD (intrauterine device).  Removal of retained placenta after giving birth.  Abortion.  Miscarriage.  Removal of endometrial polyps.  Removal of uncommon types of noncancerous lumps (fibroids).  Tell a health care provider about:  Any allergies you have, including allergies to prescribed medicine or latex.  All medicines you are taking, including vitamins, herbs, eye drops, creams, and over-the-counter medicines. This is especially important if you take any blood-thinning medicine. Bring a list of all of your medicines to your appointment.  Any problems you or family members have had with anesthetic medicines.  Any blood disorders you have.  Any surgeries you have had.  Your medical history and any medical conditions you have.  Whether you are pregnant or may be pregnant.  Recent vaginal infections you have had.  Recent menstrual periods, bleeding problems you have had, and what form of  birth control (contraception) you use. What are the risks? Generally, this is a safe procedure. However, problems may occur, including:  Infection.  Heavy vaginal bleeding.  Allergic reactions to medicines.  Damage to the cervix or other structures or organs.  Development of scar tissue (adhesions) inside the uterus, which can cause abnormal amounts of menstrual bleeding. This may make it harder to get pregnant in the future.  A hole (perforation) or puncture in the uterine wall. This is rare.  What happens before the  procedure? Staying hydrated Follow instructions from your health care provider about hydration, which may include:  Up to 2 hours before the procedure - you may continue to drink clear liquids, such as water, clear fruit juice, black coffee, and plain tea.  Eating and drinking restrictions Follow instructions from your health care provider about eating and drinking, which may include:  8 hours before the procedure - stop eating heavy meals or foods such as meat, fried foods, or fatty foods.  6 hours before the procedure - stop eating light meals or foods, such as toast or cereal.  6 hours before the procedure - stop drinking milk or drinks that contain milk.  2 hours before the procedure - stop drinking clear liquids. If your health care provider told you to take your medicine(s) on the day of your procedure, take them with only a sip of water.  Medicines  Ask your health care provider about: ? Changing or stopping your regular medicines. This is especially important if you are taking diabetes medicines or blood thinners. ? Taking medicines such as aspirin and ibuprofen. These medicines can thin your blood. Do not take these medicines before your procedure if your health care provider instructs you not to.  You may be given antibiotic medicine to help prevent infection. General instructions  For 24 hours before your procedure, do not: ? Douche. ? Use tampons. ? Use medicines, creams, or suppositories in the vagina. ? Have sexual intercourse.  You may be given a pregnancy test on the day of the procedure.  Plan to have someone take you home from the hospital or clinic.  You may have a blood or urine sample taken.  If you will be going home right after the procedure, plan to have someone with you for 24 hours. What happens during the procedure?  To reduce your risk of infection: ? Your health care team will wash or sanitize their hands. ? Your skin will be washed with  soap.  An IV tube will be inserted into one of your veins.  You will be given one of the following: ? A medicine that numbs the area in and around the cervix (local anesthetic). ? A medicine to make you fall asleep (general anesthetic).  You will lie down on your back, with your feet in foot rests (stirrups).  The size and position of your uterus will be checked.  A lubricated instrument (speculum or Sims retractor) will be inserted into the back side of your vagina. The speculum will be used to hold apart the walls of your vagina so your health care provider can see your cervix.  A tool (tenaculum) will be attached to the lip of the cervix to stabilize it.  Your cervix will be softened and dilated. This may be done by: ? Taking a medicine. ? Having tapered dilators or thin rods (laminaria) or gradual widening instruments (tapered dilators) inserted into your cervix.  A small, sharp, curved instrument (curette) will be used  to scrape a small amount of tissue or cells from the endometrium or cervical canal. In some cases, gentle suction is applied with the curette. The curette will then be removed. The cells will be taken to a lab for testing. The procedure may vary among health care providers and hospitals. What happens after the procedure?  You may have mild cramping, backache, pain, and light bleeding or spotting. You may pass small blood clots from your vagina.  You may have to wear compression stockings. These stockings help to prevent blood clots and reduce swelling in your legs.  Your blood pressure, heart rate, breathing rate, and blood oxygen level will be monitored until the medicines you were given have worn off. Summary  Dilation and curettage (D&C) involves stretching (dilation) the cervix and scraping (curettage) the inside lining of the uterus (endometrium).  After the procedure, you may have mild cramping, backache, pain, and light bleeding or spotting. You may pass  small blood clots from your vagina.  Plan to have someone take you home from the hospital or clinic. This information is not intended to replace advice given to you by your health care provider. Make sure you discuss any questions you have with your health care provider. Document Released: 05/31/2005 Document Revised: 02/15/2016 Document Reviewed: 02/15/2016 Elsevier Interactive Patient Education  2018 [employer].  Dilation and Curettage or Vacuum Curettage, Care After These instructions give you information about caring for yourself after your procedure. Your doctor may also give you more specific instructions. Call your doctor if you have any problems or questions after your procedure. Follow these instructions at home: Activity  Do not drive or use heavy machinery while taking prescription pain medicine.  For 24 hours after your procedure, avoid driving.  Take short walks often, followed by rest periods. Ask your doctor what activities are safe for you. After one or two days, you may be able to return to your normal activities.  Do not lift anything that is heavier than 10 lb (4.5 kg) until your doctor approves.  For at least 2 weeks, or as long as told by your doctor: ? Do not douche. ? Do not use tampons. ? Do not have sex. General instructions  Take over-the-counter and prescription medicines only as told by your doctor. This is very important if you take blood thinning medicine.  Do not take baths, swim, or use a hot tub until your doctor approves. Take showers instead of baths.  Wear compression stockings as told by your doctor.  It is up to you to get the results of your procedure. Ask your doctor when your results will be ready.  Keep all follow-up visits as told by your doctor. This is important. Contact a doctor if:  You have very bad cramps that get worse or do not get better with medicine.  You have very bad pain in your belly (abdomen).  You cannot drink  fluids without throwing up (vomiting).  You get pain in a different part of the area between your belly and thighs (pelvis).  You have bad-smelling discharge from your vagina.  You have a rash. Get help right away if:  You are bleeding a lot from your vagina. A lot of bleeding means soaking more than one sanitary pad in an hour, for 2 hours in a row.  You have clumps of blood (blood clots) coming from your vagina.  You have a fever or chills.  Your belly feels very tender or hard.  You have  chest pain.  You have trouble breathing.  You cough up blood.  You feel dizzy.  You feel light-headed.  You pass out (faint).  You have pain in your neck or shoulder area. Summary  Take short walks often, followed by rest periods. Ask your doctor what activities are safe for you. After one or two days, you may be able to return to your normal activities.  Do not lift anything that is heavier than 10 lb (4.5 kg) until your doctor approves.  Do not take baths, swim, or use a hot tub until your doctor approves. Take showers instead of baths.  Contact your doctor if you have any symptoms of infection, like bad-smelling discharge from your vagina. This information is not intended to replace advice given to you by your health care provider. Make sure you discuss any questions you have with your health care provider. Document Released: 03/09/2008 Document Revised: 02/16/2016 Document Reviewed: 02/16/2016 Elsevier Interactive Patient Education  2017 [employer] Hysteroscopy Hysteroscopy is a procedure used for looking inside the womb (uterus). It may be done for various reasons, including:  To evaluate abnormal bleeding, fibroid (benign, noncancerous) tumors, polyps, scar tissue (adhesions), and possibly cancer of the uterus.  To look for lumps (tumors) and other uterine growths.  To look for causes of why a woman cannot get pregnant (infertility), causes of recurrent loss of pregnancy  (miscarriages), or a lost intrauterine device (IUD).  To perform a sterilization by blocking the fallopian tubes from inside the uterus.  In this procedure, a thin, flexible tube with a tiny light and camera on the end of it (hysteroscope) is used to look inside the uterus. A hysteroscopy should be done right after a menstrual period to be sure you are not pregnant. LET [facility] CARE PROVIDER KNOW ABOUT:  Any allergies you have.  All medicines you are taking, including vitamins, herbs, eye drops, creams, and over-the-counter medicines.  Previous problems you or members of your family have had with the use of anesthetics.  Any blood disorders you have.  Previous surgeries you have had.  Medical conditions you have. RISKS AND COMPLICATIONS Generally, this is a safe procedure. However, as with any procedure, complications can occur. Possible complications include:  Putting a hole in the uterus.  Excessive bleeding.  Infection.  Damage to the cervix.  Injury to other organs.  Allergic reaction to medicines.  Too much fluid used in the uterus for the procedure.  BEFORE THE PROCEDURE  Ask your health care provider about changing or stopping any regular medicines.  Do not take aspirin or blood thinners for 1 week before the procedure, or as directed by your health care provider. These can cause bleeding.  If you smoke, do not smoke for 2 weeks before the procedure.  In some cases, a medicine is placed in the cervix the day before the procedure. This medicine makes the cervix have a larger opening (dilate). This makes it easier for the instrument to be inserted into the uterus during the procedure.  Do not eat or drink anything for at least 8 hours before the surgery.  Arrange for someone to take you home after the procedure. PROCEDURE  You may be given a medicine to relax you (sedative). You may also be given one of the following: ? A medicine that numbs the area around  the cervix (local anesthetic). ? A medicine that makes you sleep through the procedure (general anesthetic).  The hysteroscope is inserted through the vagina into  the uterus. The camera on the hysteroscope sends a picture to a TV screen. This gives the surgeon a good view inside the uterus.  During the procedure, air or a liquid is put into the uterus, which allows the surgeon to see better.  Sometimes, tissue is gently scraped from inside the uterus. These tissue samples are sent to a lab for testing. What to expect after the procedure  If you had a general anesthetic, you may be groggy for a couple hours after the procedure.  If you had a local anesthetic, you will be able to go home as soon as you are stable and feel ready.  You may have some cramping. This normally lasts for a couple days.  You may have bleeding, which varies from light spotting for a few days to menstrual-like bleeding for 3-7 days. This is normal.  If your test results are not back during the visit, make an appointment with your health care provider to find out the results. This information is not intended to replace advice given to you by your health care provider. Make sure you discuss any questions you have with your health care provider. Document Released: 09/06/2000 Document Revised: 11/06/2015 Document Reviewed: 12/28/2012 Elsevier Interactive Patient Education  2017 [employer] Inc. Hysteroscopy, Care After Refer to this sheet in the next few weeks. These instructions provide you with information on caring for yourself after your procedure. Your health care provider may also give you more specific instructions. Your treatment has been planned according to current medical practices, but problems sometimes occur. Call your health care provider if you have any problems or questions after your procedure. What can I expect after the procedure? After your procedure, it is typical to have the following:  You may have  some cramping. This normally lasts for a couple days.  You may have bleeding. This can vary from light spotting for a few days to menstrual-like bleeding for 3-7 days.  Follow these instructions at home:  Rest for the first 1-2 days after the procedure.  Only take over-the-counter or prescription medicines as directed by your health care provider. Do not take aspirin. It can increase the chances of bleeding.  Take showers instead of baths for 2 weeks or as directed by your health care provider.  Do not drive for 24 hours or as directed.  Do not drink alcohol while taking pain medicine.  Do not use tampons, douche, or have sexual intercourse for 2 weeks or until your health care provider says it is okay.  Take your temperature twice a day for 4-5 days. Write it down each time.  Follow your health care provider's advice about diet, exercise, and lifting.  If you develop constipation, you may: ? Take a mild laxative if your health care provider approves. ? Add bran foods to your diet. ? Drink enough fluids to keep your urine clear or pale yellow.  Try to have someone with you or available to you for the first 24-48 hours, especially if you were given a general anesthetic.  Follow up with your health care provider as directed. Contact a health care provider if:  You feel dizzy or lightheaded.  You feel sick to your stomach (nauseous).  You have abnormal vaginal discharge.  You have a rash.  You have pain that is not controlled with medicine. Get help right away if:  You have bleeding that is heavier than a normal menstrual period.  You have a fever.  You have increasing cramps  or pain, not controlled with medicine.  You have new belly (abdominal) pain.  You pass out.  You have pain in the tops of your shoulders (shoulder strap areas).  You have shortness of breath. This information is not intended to replace advice given to you by your health care provider. Make  sure you discuss any questions you have with your health care provider. Document Released: 03/21/2013 Document Revised: 11/06/2015 Document Reviewed: 12/28/2012 Elsevier Interactive Patient Education  2017 [employer]  Endometrial Ablation Endometrial ablation is a procedure that destroys the thin inner layer of the lining of the uterus (endometrium). This procedure may be done:  To stop heavy periods.  To stop bleeding that is causing anemia.  To control irregular bleeding.  To treat bleeding caused by small tumors (fibroids) in the endometrium.  This procedure is often an alternative to major surgery, such as removal of the uterus and cervix (hysterectomy). As a result of this procedure:  You may not be able to have children. However, if you are premenopausal (you have not gone through menopause): ? You may still have a small chance of getting pregnant. ? You will need to use a reliable method of birth control after the procedure to prevent pregnancy.  You may stop having a menstrual period, or you may have only a small amount of bleeding during your period. Menstruation may return several years after the procedure.  Tell a health care provider about:  Any allergies you have.  All medicines you are taking, including vitamins, herbs, eye drops, creams, and over-the-counter medicines.  Any problems you or family members have had with the use of anesthetic medicines.  Any blood disorders you have.  Any surgeries you have had.  Any medical conditions you have. What are the risks? Generally, this is a safe procedure. However, problems may occur, including:  A hole (perforation) in the uterus or bowel.  Infection of the uterus, bladder, or vagina.  Bleeding.  Damage to other structures or organs.  An air bubble in the lung (air embolus).  Problems with pregnancy after the procedure.  Failure of the procedure.  Decreased ability to diagnose cancer in the  endometrium.  What happens before the procedure?  You will have tests of your endometrium to make sure there are no pre-cancerous cells or cancer cells present.  You may have an ultrasound of the uterus.  You may be given medicines to thin the endometrium.  Ask your health care provider about: ? Changing or stopping your regular medicines. This is especially important if you take diabetes medicines or blood thinners. ? Taking medicines such as aspirin and ibuprofen. These medicines can thin your blood. Do not take these medicines before your procedure if your doctor tells you not to.  Plan to have someone take you home from the hospital or clinic. What happens during the procedure?  You will lie on an exam table with your feet and legs supported as in a pelvic exam.  To lower your risk of infection: ? Your health care team will wash or sanitize their hands and put on germ-free (sterile) gloves. ? Your genital area will be washed with soap.  An IV tube will be inserted into one of your veins.  You will be given a medicine to help you relax (sedative).  A surgical instrument with a light and camera (resectoscope) will be inserted into your vagina and moved into your uterus. This allows your surgeon to see inside your uterus.  Endometrial  tissue will be removed using one of the following methods: ? Radiofrequency. This method uses a radiofrequency-alternating electric current to remove the endometrium. ? Cryotherapy. This method uses extreme cold to freeze the endometrium. ? Heated-free liquid. This method uses a heated saltwater (saline) solution to remove the endometrium. ? Microwave. This method uses high-energy microwaves to heat up the endometrium and remove it. ? Thermal balloon. This method involves inserting a catheter with a balloon tip into the uterus. The balloon tip is filled with heated fluid to remove the endometrium. The procedure may vary among health care providers  and hospitals. What happens after the procedure?  Your blood pressure, heart rate, breathing rate, and blood oxygen level will be monitored until the medicines you were given have worn off.  As tissue healing occurs, you may notice vaginal bleeding for 4-6 weeks after the procedure. You may also experience: ? Cramps. ? Thin, watery vaginal discharge that is light pink or brown in color. ? A need to urinate more frequently than usual. ? Nausea.  Do not drive for 24 hours if you were given a sedative.  Do not have sex or insert anything into your vagina until your health care provider approves. Summary  Endometrial ablation is done to treat the many causes of heavy menstrual bleeding.  The procedure may be done only after medications have been tried to control the bleeding.  Plan to have someone take you home from the hospital or clinic. This information is not intended to replace advice given to you by your health care provider. Make sure you discuss any questions you have with your health care provider. Document Released: 04/09/2004 Document Revised: 06/17/2016 Document Reviewed: 06/17/2016 Elsevier Interactive Patient Education  2017 [employer]  General Anesthesia, Adult General anesthesia is the use of medicines to make a person "go to sleep" (be unconscious) for a medical procedure. General anesthesia is often recommended when a procedure:  Is long.  Requires you to be still or in an unusual position.  Is major and can cause you to lose blood.  Is impossible to do without general anesthesia.  The medicines used for general anesthesia are called general anesthetics. In addition to making you sleep, the medicines:  Prevent pain.  Control your blood pressure.  Relax your muscles.  Tell a health care provider about:  Any allergies you have.  All medicines you are taking, including vitamins, herbs, eye drops, creams, and over-the-counter medicines.  Any problems  you or family members have had with anesthetic medicines.  Types of anesthetics you have had in the past.  Any bleeding disorders you have.  Any surgeries you have had.  Any medical conditions you have.  Any history of heart or lung conditions, such as heart failure, sleep apnea, or chronic obstructive pulmonary disease (COPD).  Whether you are pregnant or may be pregnant.  Whether you use tobacco, alcohol, marijuana, or street drugs.  Any history of Financial [employer].  Any history of depression or anxiety. What are the risks? Generally, this is a safe procedure. However, problems may occur, including:  Allergic reaction to anesthetics.  Lung and heart problems.  Inhaling food or liquids from your stomach into your lungs (aspiration).  Injury to nerves.  Waking up during your procedure and being unable to move (rare).  Extreme agitation or a state of mental confusion (delirium) when you wake up from the anesthetic.  Air in the bloodstream, which can lead to stroke.  These problems are more likely to  develop if you are having a major surgery or if you have an advanced medical condition. You can prevent some of these complications by answering all of your health care provider's questions thoroughly and by following all pre-procedure instructions. General anesthesia can cause side effects, including:  Nausea or vomiting  A sore throat from the breathing tube.  Feeling cold or shivery.  Feeling tired, washed out, or achy.  Sleepiness or drowsiness.  Confusion or agitation.  What happens before the procedure? Staying hydrated Follow instructions from your health care provider about hydration, which may include:  Up to 2 hours before the procedure - you may continue to drink clear liquids, such as water, clear fruit juice, black coffee, and plain tea.  Eating and drinking restrictions Follow instructions from your health care provider about eating and drinking,  which may include:  8 hours before the procedure - stop eating heavy meals or foods such as meat, fried foods, or fatty foods.  6 hours before the procedure - stop eating light meals or foods, such as toast or cereal.  6 hours before the procedure - stop drinking milk or drinks that contain milk.  2 hours before the procedure - stop drinking clear liquids.  Medicines  Ask your health care provider about: ? Changing or stopping your regular medicines. This is especially important if you are taking diabetes medicines or blood thinners. ? Taking medicines such as aspirin and ibuprofen. These medicines can thin your blood. Do not take these medicines before your procedure if your health care provider instructs you not to. ? Taking new dietary supplements or medicines. Do not take these during the week before your procedure unless your health care provider approves them.  If you are told to take a medicine or to continue taking a medicine on the day of the procedure, take the medicine with sips of water. General instructions   Ask if you will be going home the same day, the following day, or after a longer hospital stay. ? Plan to have someone take you home. ? Plan to have someone stay with you for the first 24 hours after you leave the hospital or clinic.  For 3-6 weeks before the procedure, try not to use any tobacco products, such as cigarettes, chewing tobacco, and e-cigarettes.  You may brush your teeth on the morning of the procedure, but make sure to spit out the toothpaste. What happens during the procedure?  You will be given anesthetics through a mask and through an IV tube in one of your veins.  You may receive medicine to help you relax (sedative).  As soon as you are asleep, a breathing tube may be used to help you breathe.  An anesthesia specialist will stay with you throughout the procedure. He or she will help keep you comfortable and safe by continuing to give you  medicines and adjusting the amount of medicine that you get. He or she will also watch your blood pressure, pulse, and oxygen levels to make sure that the anesthetics do not cause any problems.  If a breathing tube was used to help you breathe, it will be removed before you wake up. The procedure may vary among health care providers and hospitals. What happens after the procedure?  You will wake up, often slowly, after the procedure is complete, usually in a recovery area.  Your blood pressure, heart rate, breathing rate, and blood oxygen level will be monitored until the medicines you were given have worn  off.  You may be given medicine to help you calm down if you feel anxious or agitated.  If you will be going home the same day, your health care provider may check to make sure you can stand, drink, and urinate.  Your health care providers will treat your pain and side effects before you go home.  Do not drive for 24 hours if you received a sedative.  You may: ? Feel nauseous and vomit. ? Have a sore throat. ? Have mental slowness. ? Feel cold or shivery. ? Feel sleepy. ? Feel tired. ? Feel sore or achy, even in parts of your body where you did not have surgery. This information is not intended to replace advice given to you by your health care provider. Make sure you discuss any questions you have with your health care provider. Document Released: 09/07/2007 Document Revised: 11/11/2015 Document Reviewed: 05/15/2015 Elsevier Interactive Patient Education  2018 [employer]. General Anesthesia, Adult, Care After These instructions provide you with information about caring for yourself after your procedure. Your health care provider may also give you more specific instructions. Your treatment has been planned according to current medical practices, but problems sometimes occur. Call your health care provider if you have any problems or questions after your procedure. What can I  expect after the procedure? After the procedure, it is common to have:  Vomiting.  A sore throat.  Mental slowness.  It is common to feel:  Nauseous.  Cold or shivery.  Sleepy.  Tired.  Sore or achy, even in parts of your body where you did not have surgery.  Follow these instructions at home: For at least 24 hours after the procedure:  Do not: ? Participate in activities where you could fall or become injured. ? Drive. ? Use heavy machinery. ? Drink alcohol. ? Take sleeping pills or medicines that cause drowsiness. ? Make important decisions or sign legal documents. ? Take care of children on your own.  Rest. Eating and drinking  If you vomit, drink water, juice, or soup when you can drink without vomiting.  Drink enough fluid to keep your urine clear or pale yellow.  Make sure you have little or no nausea before eating solid foods.  Follow the diet recommended by your health care provider. General instructions  Have a responsible adult stay with you until you are awake and alert.  Return to your normal activities as told by your health care provider. Ask your health care provider what activities are safe for you.  Take over-the-counter and prescription medicines only as told by your health care provider.  If you smoke, do not smoke without supervision.  Keep all follow-up visits as told by your health care provider. This is important. Contact a health care provider if:  You continue to have nausea or vomiting at home, and medicines are not helpful.  You cannot drink fluids or start eating again.  You cannot urinate after 8-12 hours.  You develop a skin rash.  You have fever.  You have increasing redness at the site of your procedure. Get help right away if:  You have difficulty breathing.  You have chest pain.  You have unexpected bleeding.  You feel that you are having a life-threatening or urgent problem. This information is not intended  to replace advice given to you by your health care provider. Make sure you discuss any questions you have with your health care provider. Document Released: 09/06/2000 Document Revised: 11/03/2015 Document Reviewed:  05/15/2015 Elsevier Interactive Patient Education  [employer].

## 2018-03-29 ENCOUNTER — Other Ambulatory Visit: Payer: Self-pay | Admitting: Obstetrics & Gynecology

## 2018-03-30 ENCOUNTER — Encounter (HOSPITAL_COMMUNITY): Payer: Self-pay

## 2018-03-30 ENCOUNTER — Encounter (HOSPITAL_COMMUNITY)
Admission: RE | Admit: 2018-03-30 | Discharge: 2018-03-30 | Disposition: A | Discharge status: Home / Self Care | Payer: BLUE CROSS/BLUE SHIELD | Source: Ambulatory Visit | Attending: Obstetrics & Gynecology | Admitting: Obstetrics & Gynecology

## 2018-03-30 DIAGNOSIS — Z01812 Encounter for preprocedural laboratory examination: Secondary | ICD-10-CM | POA: Diagnosis not present

## 2018-03-30 LAB — COMPREHENSIVE METABOLIC PANEL
ALBUMIN: 3.9 g/dL (ref 3.5–5.0)
ALT: 14 U/L (ref 0–44)
ANION GAP: 8 (ref 5–15)
AST: 19 U/L (ref 15–41)
Alkaline Phosphatase: 38 U/L (ref 38–126)
BUN: 9 mg/dL (ref 6–20)
CO2: 22 mmol/L (ref 22–32)
Calcium: 8.5 mg/dL — ABNORMAL LOW (ref 8.9–10.3)
Chloride: 106 mmol/L (ref 98–111)
Creatinine, Ser: 0.8 mg/dL (ref 0.44–1.00)
GFR calc Af Amer: >60 mL/min (ref >60)
GFR calc non Af Amer: >60 mL/min (ref >60)
GLUCOSE: 131 mg/dL — AB (ref 70–99)
POTASSIUM: 3.3 mmol/L — AB (ref 3.5–5.1)
SODIUM: 136 mmol/L (ref 135–145)
Total Bilirubin: 0.4 mg/dL (ref 0.3–1.2)
Total Protein: 7.4 g/dL (ref 6.5–8.1)

## 2018-03-30 LAB — CBC
HCT: 42.9 % (ref 36.0–46.0)
HEMOGLOBIN: 13.5 g/dL (ref 12.0–15.0)
MCH: 25.8 pg — ABNORMAL LOW (ref 26.0–34.0)
MCHC: 31.5 g/dL (ref 30.0–36.0)
MCV: 82 fL (ref 80.0–100.0)
Platelets: 266 10*3/uL (ref 150–400)
RBC: 5.23 MIL/uL — ABNORMAL HIGH (ref 3.87–5.11)
RDW: 19.2 % — AB (ref 11.5–15.5)
WBC: 5.3 10*3/uL (ref 4.0–10.5)
nRBC: 0 % (ref 0.0–0.2)

## 2018-03-30 LAB — RAPID HIV SCREEN (HIV 1/2 AB+AG)
HIV 1/2 ANTIBODIES: NONREACTIVE
HIV-1 P24 Antigen - HIV24: NONREACTIVE

## 2018-03-30 LAB — HCG, QUANTITATIVE, PREGNANCY: hCG, Beta Chain, Quant, S: <1 m[IU]/mL (ref <5)

## 2018-04-01 ENCOUNTER — Emergency Department (HOSPITAL_COMMUNITY)
Admission: EM | Admit: 2018-04-01 | Discharge: 2018-04-01 | Disposition: A | Discharge status: Home / Self Care | Payer: BLUE CROSS/BLUE SHIELD | Attending: Emergency Medicine | Admitting: Emergency Medicine

## 2018-04-01 ENCOUNTER — Encounter (HOSPITAL_COMMUNITY): Payer: Self-pay

## 2018-04-01 ENCOUNTER — Other Ambulatory Visit: Payer: Self-pay

## 2018-04-01 ENCOUNTER — Other Ambulatory Visit: Payer: Self-pay | Admitting: Adult Health

## 2018-04-01 DIAGNOSIS — F329 Major depressive disorder, single episode, unspecified: Secondary | ICD-10-CM | POA: Insufficient documentation

## 2018-04-01 DIAGNOSIS — N939 Abnormal uterine and vaginal bleeding, unspecified: Secondary | ICD-10-CM | POA: Insufficient documentation

## 2018-04-01 DIAGNOSIS — Z79899 Other long term (current) drug therapy: Secondary | ICD-10-CM | POA: Insufficient documentation

## 2018-04-01 DIAGNOSIS — Z87891 Personal history of nicotine dependence: Secondary | ICD-10-CM | POA: Insufficient documentation

## 2018-04-01 DIAGNOSIS — F419 Anxiety disorder, unspecified: Secondary | ICD-10-CM | POA: Insufficient documentation

## 2018-04-01 LAB — CBC
HCT: 39.7 % (ref 36.0–46.0)
Hemoglobin: 12.9 g/dL (ref 12.0–15.0)
MCH: 26.9 pg (ref 26.0–34.0)
MCHC: 32.5 g/dL (ref 30.0–36.0)
MCV: 82.9 fL (ref 80.0–100.0)
Platelets: 252 10*3/uL (ref 150–400)
RBC: 4.79 MIL/uL (ref 3.87–5.11)
RDW: 18.3 % — AB (ref 11.5–15.5)
WBC: 6 10*3/uL (ref 4.0–10.5)
nRBC: 0 % (ref 0.0–0.2)

## 2018-04-01 LAB — HCG, QUANTITATIVE, PREGNANCY

## 2018-04-01 MED ORDER — TRIAMCINOLONE ACETONIDE 0.5 % EX OINT: 1.0000 "application " | TOPICAL_OINTMENT | Freq: Two times a day (BID) | CUTANEOUS | 1 refills | Status: AC

## 2018-04-01 MED ORDER — MEGESTROL ACETATE 40 MG PO TABS: 40.0000 mg | ORAL_TABLET | Freq: Four times a day (QID) | ORAL | 0 refills | Status: DC

## 2018-04-01 NOTE — Discharge Instructions (Signed)
See your OB/GYN on Wednesday for your endometrial ablation.  Please increase your dose of Megestrol 40 mg 4 times daily.  Return to the emergency department for severe pain, increased vomiting, increased vaginal bleeding or lightheadedness.  Please be aware that your blood test showed that you are not anemic today.

## 2018-04-01 NOTE — ED Triage Notes (Signed)
Pt has been bleeding since this morning. Is scheduled to have an ablation this November 13, 2022. Has passed a clot that she has also brought in with her. Is weak and dizzy

## 2018-04-01 NOTE — ED Provider Notes (Signed)
[facility] EMERGENCY DEPARTMENT Provider Note   CSN: [id number] Arrival date & time: 04/01/18  1129     History   Chief Complaint Chief Complaint  Patient presents with  . Vaginal Bleeding    HPI Jennifer Greer is a 47 y.o. female.  HPI  The patient is a 48 year old female, she has a known history of substance abuse, she has a history of anemia, she was found to have abnormal bleeding back in June when she had very heavy bleeding and was seen at the gynecologist office, started on Megace all which she has been taking since that time.  She reports that the bleeding seems to have recurred and over the last 24 hours has had heavy amounts of vaginal bleeding and then passed a very abnormal appearing of gray-brown and red tissue.  She has some lower abdominal cramping, no other symptoms.  Past Medical History:  Diagnosis Date  . Anemia   . Anxiety   . Depression   . Fatty liver   . Fatty liver   . Mental disorder   . Panic attack   . Substance abuse [facility])     Patient Active Problem List   Diagnosis Date Noted  . Menorrhagia with regular cycle 03/07/2018  . Iron deficiency anemia due to chronic blood loss 01/09/2018  . Low serum ferritin level 01/04/2018  . Encounter for well woman exam with routine gynecological exam 01/03/2018  . DUB (dysfunctional uterine bleeding) 01/03/2018  . Anemia 01/03/2018  . Screening for colorectal cancer 01/03/2018  . Depression, recurrent (HCC) 07/15/2016  . Panic disorder 07/15/2016  . Fatty liver 07/15/2016  . Benzodiazepine dependence (HCC) 07/15/2016  . Psoriasis 07/15/2016  . Tobacco abuse 07/15/2016    Past Surgical History:  Procedure Laterality Date  . BACK SURGERY    . rotar cuff repair    . SPINE SURGERY    . TUBAL LIGATION       OB History    Gravida  5   Para  3   Term  3   Preterm      AB  2   Living  3     SAB      TAB      Ectopic      Multiple      Live Births  3            Home  Medications    Prior to Admission medications   Medication Sig Start Date End Date Taking? Authorizing Provider  ALPRAZolam Prudy Feeler) 1 MG tablet Take 1 mg by mouth 3 (three) times daily.    [provider]  DULoxetine (CYMBALTA) 30 MG capsule Take 1 capsule (30 mg total) by mouth daily. 01/09/18   Adline Potter, NP  ibuprofen (ADVIL,MOTRIN) 200 MG tablet Take 400-800 mg by mouth every 8 (eight) hours as needed (pain).    [provider]  Iron-FA-B Cmp-C-Biot-Probiotic (FUSION PLUS) CAPS Take 1 capsule by mouth daily. 01/03/18   Adline Potter, NP  megestrol (MEGACE) 40 MG tablet Take 1 tablet (40 mg total) by mouth 4 (four) times daily for 7 days. Take twice daily until bleeding stops - consult with your Gynecologist before starting this medicine 04/01/18 04/08/18  Eber Hong, MD    Family History Family History  Problem Relation Age of Onset  . Emphysema Paternal Grandfather   . Emphysema Paternal Grandmother   . Breast cancer Maternal Grandmother   . Leukemia Maternal Grandmother   . Diabetes  Maternal Grandmother   . Hypertension Maternal Grandmother   . Stroke Maternal Grandmother   . Heart attack Maternal Grandmother   . Cancer Maternal Grandfather        brain  . Panic disorder Father   . Hypertension Father   . Stroke Father   . Diabetes Father   . Other Father        heart issues  . Heart disease Father   . Hyperlipidemia Father   . Panic disorder Mother   . Hypertension Mother   . Anxiety disorder Sister   . Panic disorder Sister   . Hypertension Son     Social History Social History   Tobacco Use  . Smoking status: Former Smoker    Packs/day: 0.50    Years: 15.00    Pack years: 7.50    Types: Cigarettes    Start date: 06/14/1984  . Smokeless tobacco: Never Used  . Tobacco comment: DISCUSSED  Substance Use Topics  . Alcohol use: Yes    Comment: occasionally  . Drug use: No     Allergies   Patient has no known  allergies.   Review of Systems Review of Systems  All other systems reviewed and are negative.    Physical Exam Updated Vital Signs BP 128/74 (BP Location: Left Arm)   Pulse 62   Resp 12   Ht 1.753 m (5\' 9" )   Wt 75.3 kg   SpO2 99%   BMI 24.51 kg/m   Physical Exam  Constitutional: She appears well-developed and well-nourished. No distress.  HENT:  Head: Normocephalic and atraumatic.  Mouth/Throat: Oropharynx is clear and moist. No oropharyngeal exudate.  Eyes: Pupils are equal, round, and reactive to light. Conjunctivae and EOM are normal. Right eye exhibits no discharge. Left eye exhibits no discharge. No scleral icterus.  Neck: Normal range of motion. Neck supple. No JVD present. No thyromegaly present.  Cardiovascular: Normal rate, regular rhythm, normal heart sounds and intact distal pulses. Exam reveals no gallop and no friction rub.  No murmur heard. Pulmonary/Chest: Effort normal and breath sounds normal. No respiratory distress. She has no wheezes. She has no rales.  Abdominal: Soft. Bowel sounds are normal. She exhibits no distension and no mass. There is no tenderness.  Genitourinary:  Genitourinary Comments: Small amount of blood coming from the cervix, no foreign bodies in the vaginal vault  Musculoskeletal: Normal range of motion. She exhibits no edema or tenderness.  Lymphadenopathy:    She has no cervical adenopathy.  Neurological: She is alert. Coordination normal.  Skin: Skin is warm and dry. No rash noted. No erythema.  Psychiatric: She has a normal mood and affect. Her behavior is normal.  Nursing note and vitals reviewed.    ED Treatments / Results  Labs (all labs ordered are listed, but only abnormal results are displayed) Labs Reviewed  CBC - Abnormal; Notable for the following components:      Result Value   RDW 18.3 (*)    All other components within normal limits  HCG, QUANTITATIVE, PREGNANCY    EKG None  Radiology No results  found.  Procedures Procedures (including critical care time)  Medications Ordered in ED Medications - No data to display   Initial Impression / Assessment and Plan / ED Course  I have reviewed the triage vital signs and the nursing notes.  Pertinent labs & imaging results that were available during my care of the patient were reviewed by me and considered in my medical decision  making (see chart for details).    We will proceed with checking hemoglobin, discussed with OB/GYN, the patient is hemodynamically stable  Hemoglobin normal, patient given reassurance, increase Megace dose to 4 times daily, follow-up on Wednesday for endometrial ablation which is Artie scheduled  The patient is agreeable to the plan  Vitals:   04/01/18 1136 04/01/18 1137  BP:  128/74  Pulse:  62  Resp:  12  SpO2:  99%  Weight: 75.3 kg   Height: 1.753 m (5\' 9" )      Final Clinical Impressions(s) / ED Diagnoses   Final diagnoses:  Vaginal bleeding    ED Discharge Orders         Ordered    megestrol (MEGACE) 40 MG tablet  4 times daily     04/01/18 1437           Eber Hong, MD 04/01/18 1438

## 2018-04-03 ENCOUNTER — Telehealth: Payer: Self-pay | Admitting: *Deleted

## 2018-04-04 NOTE — Telephone Encounter (Signed)
Spoke to Dr Despina Hidden who stated it sounded like she passed a decidual cast but ok to still proceed with surgery.

## 2018-04-05 ENCOUNTER — Ambulatory Visit (HOSPITAL_COMMUNITY): Payer: BLUE CROSS/BLUE SHIELD | Admitting: Anesthesiology

## 2018-04-05 ENCOUNTER — Ambulatory Visit (HOSPITAL_COMMUNITY)
Admission: RE | Admit: 2018-04-05 | Discharge: 2018-04-05 | Disposition: A | Discharge status: Home / Self Care | Payer: BLUE CROSS/BLUE SHIELD | Source: Ambulatory Visit | Attending: Obstetrics & Gynecology | Admitting: Obstetrics & Gynecology

## 2018-04-05 ENCOUNTER — Encounter (HOSPITAL_COMMUNITY)
Admission: RE | Disposition: A | Discharge status: Home / Self Care | Payer: Self-pay | Source: Ambulatory Visit | Attending: Obstetrics & Gynecology

## 2018-04-05 ENCOUNTER — Encounter (HOSPITAL_COMMUNITY): Payer: Self-pay | Admitting: Anesthesiology

## 2018-04-05 DIAGNOSIS — F329 Major depressive disorder, single episode, unspecified: Secondary | ICD-10-CM | POA: Insufficient documentation

## 2018-04-05 DIAGNOSIS — Z79899 Other long term (current) drug therapy: Secondary | ICD-10-CM | POA: Insufficient documentation

## 2018-04-05 DIAGNOSIS — N920 Excessive and frequent menstruation with regular cycle: Secondary | ICD-10-CM

## 2018-04-05 DIAGNOSIS — Z791 Long term (current) use of non-steroidal anti-inflammatories (NSAID): Secondary | ICD-10-CM | POA: Insufficient documentation

## 2018-04-05 DIAGNOSIS — Z87891 Personal history of nicotine dependence: Secondary | ICD-10-CM | POA: Insufficient documentation

## 2018-04-05 DIAGNOSIS — D500 Iron deficiency anemia secondary to blood loss (chronic): Secondary | ICD-10-CM | POA: Insufficient documentation

## 2018-04-05 DIAGNOSIS — F419 Anxiety disorder, unspecified: Secondary | ICD-10-CM | POA: Insufficient documentation

## 2018-04-05 DIAGNOSIS — Z79818 Long term (current) use of other agents affecting estrogen receptors and estrogen levels: Secondary | ICD-10-CM | POA: Insufficient documentation

## 2018-04-05 HISTORY — PX: DILITATION & CURRETTAGE/HYSTROSCOPY WITH NOVASURE ABLATION: SHX5568

## 2018-04-05 LAB — URINALYSIS, MICROSCOPIC (REFLEX)

## 2018-04-05 LAB — URINALYSIS, ROUTINE W REFLEX MICROSCOPIC
BILIRUBIN URINE: NEGATIVE
GLUCOSE, UA: NEGATIVE mg/dL
KETONES UR: NEGATIVE mg/dL
NITRITE: NEGATIVE
Protein, ur: 100 mg/dL — AB
Specific Gravity, Urine: <1.005 — ABNORMAL LOW (ref 1.005–1.030)
pH: 5.5 (ref 5.0–8.0)

## 2018-04-05 SURGERY — DILATATION & CURETTAGE/HYSTEROSCOPY WITH NOVASURE ABLATION
Anesthesia: General

## 2018-04-05 MED ORDER — ONDANSETRON HCL 4 MG/2ML IJ SOLN
INTRAMUSCULAR | Status: DC | PRN
  Administered 2018-04-05: 4 mg via INTRAVENOUS

## 2018-04-05 MED ORDER — PROMETHAZINE HCL 25 MG/ML IJ SOLN: 6.2500 mg | INTRAMUSCULAR | Status: DC | PRN

## 2018-04-05 MED ORDER — CEFAZOLIN SODIUM-DEXTROSE 2-4 GM/100ML-% IV SOLN
2.0000 g | INTRAVENOUS | Status: AC
  Administered 2018-04-05: 2 g via INTRAVENOUS
  Filled 2018-04-05: qty 100

## 2018-04-05 MED ORDER — SODIUM CHLORIDE 0.9 % IR SOLN
Status: DC | PRN
  Administered 2018-04-05: 3000 mL

## 2018-04-05 MED ORDER — MIDAZOLAM HCL 2 MG/2ML IJ SOLN: 0.5000 mg | Freq: Once | INTRAMUSCULAR | Status: DC | PRN

## 2018-04-05 MED ORDER — 0.9 % SODIUM CHLORIDE (POUR BTL) OPTIME
TOPICAL | Status: DC | PRN
  Administered 2018-04-05: 1000 mL

## 2018-04-05 MED ORDER — MIDAZOLAM HCL 5 MG/5ML IJ SOLN
INTRAMUSCULAR | Status: DC | PRN
  Administered 2018-04-05: 2 mg via INTRAVENOUS

## 2018-04-05 MED ORDER — MIDAZOLAM HCL 2 MG/2ML IJ SOLN
INTRAMUSCULAR | Status: AC
  Filled 2018-04-05: qty 2

## 2018-04-05 MED ORDER — PROPOFOL 10 MG/ML IV BOLUS
INTRAVENOUS | Status: DC | PRN
  Administered 2018-04-05: 200 mg via INTRAVENOUS

## 2018-04-05 MED ORDER — HYDROCODONE-ACETAMINOPHEN 5-325 MG PO TABS: 1.0000 | ORAL_TABLET | Freq: Four times a day (QID) | ORAL | 0 refills | Status: DC | PRN

## 2018-04-05 MED ORDER — KETOROLAC TROMETHAMINE 30 MG/ML IJ SOLN
30.0000 mg | Freq: Once | INTRAMUSCULAR | Status: AC
  Administered 2018-04-05: 30 mg via INTRAVENOUS

## 2018-04-05 MED ORDER — FENTANYL CITRATE (PF) 250 MCG/5ML IJ SOLN
INTRAMUSCULAR | Status: AC
  Filled 2018-04-05: qty 5

## 2018-04-05 MED ORDER — KETOROLAC TROMETHAMINE 10 MG PO TABS: 10.0000 mg | ORAL_TABLET | Freq: Three times a day (TID) | ORAL | 0 refills | Status: DC | PRN

## 2018-04-05 MED ORDER — HYDROCODONE-ACETAMINOPHEN 7.5-325 MG PO TABS
1.0000 | ORAL_TABLET | Freq: Once | ORAL | Status: AC | PRN
  Administered 2018-04-05: 1 via ORAL
  Filled 2018-04-05: qty 1

## 2018-04-05 MED ORDER — FENTANYL CITRATE (PF) 100 MCG/2ML IJ SOLN
INTRAMUSCULAR | Status: DC | PRN
  Administered 2018-04-05: 50 ug via INTRAVENOUS
  Administered 2018-04-05: 25 ug via INTRAVENOUS
  Administered 2018-04-05 (×2): 50 ug via INTRAVENOUS
  Administered 2018-04-05: 25 ug via INTRAVENOUS
  Administered 2018-04-05: 50 ug via INTRAVENOUS

## 2018-04-05 MED ORDER — ONDANSETRON HCL 4 MG/2ML IJ SOLN
INTRAMUSCULAR | Status: AC
  Filled 2018-04-05: qty 2

## 2018-04-05 MED ORDER — KETOROLAC TROMETHAMINE 30 MG/ML IJ SOLN
INTRAMUSCULAR | Status: AC
  Filled 2018-04-05: qty 1

## 2018-04-05 MED ORDER — LIDOCAINE HCL (PF) 1 % IJ SOLN
INTRAMUSCULAR | Status: AC
  Filled 2018-04-05: qty 10

## 2018-04-05 MED ORDER — ONDANSETRON 8 MG PO TBDP: 8.0000 mg | ORAL_TABLET | Freq: Three times a day (TID) | ORAL | 0 refills | Status: DC | PRN

## 2018-04-05 MED ORDER — HYDROMORPHONE HCL 1 MG/ML IJ SOLN: 0.2500 mg | INTRAMUSCULAR | Status: DC | PRN

## 2018-04-05 MED ORDER — MIDAZOLAM HCL 2 MG/2ML IJ SOLN
1.0000 mg | INTRAMUSCULAR | Status: AC
  Administered 2018-04-05 (×2): 1 mg via INTRAVENOUS

## 2018-04-05 MED ORDER — LACTATED RINGERS IV SOLN
INTRAVENOUS | Status: DC
  Administered 2018-04-05: 12:00:00 via INTRAVENOUS

## 2018-04-05 SURGICAL SUPPLY — 28 items
CLOTH BEACON ORANGE TIMEOUT ST (SAFETY) ×2 IMPLANT
COVER LIGHT HANDLE STERIS (MISCELLANEOUS) ×4 IMPLANT
GAUZE 4X4 16PLY RFD (DISPOSABLE) ×4 IMPLANT
GLOVE BIOGEL PI IND STRL 7.0 (GLOVE) ×2 IMPLANT
GLOVE BIOGEL PI IND STRL 8 (GLOVE) ×1 IMPLANT
GLOVE BIOGEL PI INDICATOR 7.0 (GLOVE) ×2
GLOVE BIOGEL PI INDICATOR 8 (GLOVE) ×1
GLOVE ECLIPSE 8.0 STRL XLNG CF (GLOVE) ×2 IMPLANT
GOWN STRL REUS W/TWL LRG LVL3 (GOWN DISPOSABLE) ×2 IMPLANT
GOWN STRL REUS W/TWL XL LVL3 (GOWN DISPOSABLE) ×2 IMPLANT
HANDPIECE ABLA MINERVA ENDO (MISCELLANEOUS) ×2 IMPLANT
INST SET HYSTEROSCOPY (KITS) ×2 IMPLANT
IV NS 1000ML (IV SOLUTION) ×1
IV NS 1000ML BAXH (IV SOLUTION) ×1 IMPLANT
IV NS IRRIG 3000ML ARTHROMATIC (IV SOLUTION) ×2 IMPLANT
KIT TURNOVER CYSTO (KITS) ×2 IMPLANT
MANIFOLD NEPTUNE II (INSTRUMENTS) ×2 IMPLANT
MARKER SKIN DUAL TIP RULER LAB (MISCELLANEOUS) ×2 IMPLANT
NS IRRIG 1000ML POUR BTL (IV SOLUTION) ×2 IMPLANT
PACK BASIC III (CUSTOM PROCEDURE TRAY) ×1
PACK SRG BSC III STRL LF ECLPS (CUSTOM PROCEDURE TRAY) ×1 IMPLANT
PAD ABD 5X9 TENDERSORB (GAUZE/BANDAGES/DRESSINGS) ×2 IMPLANT
PAD ARMBOARD 7.5X6 YLW CONV (MISCELLANEOUS) ×2 IMPLANT
PAD TELFA 3X4 1S STER (GAUZE/BANDAGES/DRESSINGS) ×2 IMPLANT
SET BASIN LINEN APH (SET/KITS/TRAYS/PACK) ×2 IMPLANT
SET IRRIG Y TYPE TUR BLADDER L (SET/KITS/TRAYS/PACK) ×2 IMPLANT
SHEET LAVH (DRAPES) ×2 IMPLANT
YANKAUER SUCT BULB TIP 10FT TU (MISCELLANEOUS) ×2 IMPLANT

## 2018-04-05 NOTE — Anesthesia Preprocedure Evaluation (Signed)
Anesthesia Evaluation  Patient identified by MRN, date of birth, ID band Patient awake    Reviewed: Allergy & Precautions, NPO status , Patient's Chart, lab work & pertinent test results  Airway Mallampati: I  TM Distance: >3 FB Neck ROM: Full    Dental no notable dental hx. (+) Teeth Intact, Missing   Pulmonary neg pulmonary ROS, former smoker,  Ex smoker x1.5 months    Pulmonary exam normal breath sounds clear to auscultation       Cardiovascular Exercise Tolerance: Good negative cardio ROS Normal cardiovascular examI Rhythm:Regular Rate:Normal     Neuro/Psych Anxiety Depression negative neurological ROS  negative psych ROS   GI/Hepatic negative GI ROS, Neg liver ROS, Reports h/o fatty liver Dz, states no ETOH in 1.5 years   Endo/Other  negative endocrine ROS  Renal/GU negative Renal ROS  negative genitourinary   Musculoskeletal negative musculoskeletal ROS (+)   Abdominal   Peds negative pediatric ROS (+)  Hematology negative hematology ROS (+) anemia ,   Anesthesia Other Findings   Reproductive/Obstetrics negative OB ROS                             Anesthesia Physical Anesthesia Plan  ASA: II  Anesthesia Plan: General   Post-op Pain Management:    Induction: Intravenous  PONV Risk Score and Plan:   Airway Management Planned: LMA  Additional Equipment:   Intra-op Plan:   Post-operative Plan: Extubation in OR  Informed Consent: I have reviewed the patients History and Physical, chart, labs and discussed the procedure including the risks, benefits and alternatives for the proposed anesthesia with the patient or authorized representative who has indicated his/her understanding and acceptance.   Dental advisory given  Plan Discussed with: CRNA  Anesthesia Plan Comments: (LMA vs GETA as needed Pt requests preop BZDs, h/o panic attacks )        Anesthesia Quick  Evaluation

## 2018-04-05 NOTE — Anesthesia Procedure Notes (Signed)
Procedure Name: LMA Insertion Date/Time: 04/05/2018 1:25 PM Performed by: Moshe Salisbury, CRNA Pre-anesthesia Checklist: Patient identified, Patient being monitored, Emergency Drugs available, Timeout performed and Suction available Patient Re-evaluated:Patient Re-evaluated prior to induction Oxygen Delivery Method: Circle System Utilized Preoxygenation: Pre-oxygenation with 100% oxygen Induction Type: IV induction Ventilation: Mask ventilation without difficulty LMA: LMA inserted LMA Size: 4.0 Number of attempts: 1 Placement Confirmation: positive ETCO2 and breath sounds checked- equal and bilateral

## 2018-04-05 NOTE — Anesthesia Postprocedure Evaluation (Signed)
Anesthesia Post Note  Patient: Jennifer Greer  Procedure(s) Performed: DILATATION & CURETTAGE/HYSTEROSCOPY WITH MINERVA  ABLATION (N/A )  Patient location during evaluation: PACU Anesthesia Type: General Level of consciousness: awake and alert and oriented Pain management: pain level controlled Vital Signs Assessment: post-procedure vital signs reviewed and stable Respiratory status: spontaneous breathing Cardiovascular status: blood pressure returned to baseline Postop Assessment: no apparent nausea or vomiting Anesthetic complications: no     Last Vitals:  Vitals:   04/05/18 1225 04/05/18 1413  BP:  136/87  Pulse:  (!) 125  Resp: (!) 22 13  Temp:  36.9 C  SpO2: 98% 98%    Last Pain:  Vitals:   04/05/18 1413  TempSrc:   PainSc: 4                  Antanette Richwine

## 2018-04-05 NOTE — Transfer of Care (Signed)
Immediate Anesthesia Transfer of Care Note  Patient: Jennifer Greer  Procedure(s) Performed: DILATATION & CURETTAGE/HYSTEROSCOPY WITH MINERVA  ABLATION (N/A )  Patient Location: PACU  Anesthesia Type:General  Level of Consciousness: awake  Airway & Oxygen Therapy: Patient Spontanous Breathing  Post-op Assessment: Report given to RN  Post vital signs: Reviewed and stable  Last Vitals:  Vitals Value Taken Time  BP 136/87 04/05/2018  2:13 PM  Temp 36.9 C 04/05/2018  2:13 PM  Pulse 115 04/05/2018  2:16 PM  Resp 14 04/05/2018  2:16 PM  SpO2 99 % 04/05/2018  2:16 PM  Vitals shown include unvalidated device data.  Last Pain:  Vitals:   04/05/18 1131  TempSrc: Oral  PainSc: 0-No pain      Patients Stated Pain Goal: 6 (04/05/18 1131)  Complications: No apparent anesthesia complications

## 2018-04-05 NOTE — H&P (Signed)
Preoperative History and Physical  Jennifer Greer is a 47 y.o. [id number] with No LMP recorded. admitted for a hsyteroscopy uterine curettage endometrial ablation for menometrorrhagia dysmenorrhea.  No dyspareunia or pelvic pain chronically  PMH:        Past Medical History:  Diagnosis Date  . Anemia   . Anxiety   . Depression   . Fatty liver   . Fatty liver   . Mental disorder   . Panic attack   . Substance abuse (HCC)     PSH:          Past Surgical History:  Procedure Laterality Date  . BACK SURGERY    . rotar cuff repair    . SPINE SURGERY    . TUBAL LIGATION      POb/GynH:              OB History    Gravida  5   Para  3   Term  3   Preterm      AB  2   Living  3     SAB      TAB      Ectopic      Multiple      Live Births  3           SH:   Social History        Tobacco Use  . Smoking status: Former Smoker    Packs/day: 0.50    Years: 15.00    Pack years: 7.50    Types: Cigarettes    Start date: 06/14/1984  . Smokeless tobacco: Never Used  . Tobacco comment: DISCUSSED  Substance Use Topics  . Alcohol use: Yes    Comment: occasionally  . Drug use: No    FH:         Family History  Problem Relation Age of Onset  . Emphysema Paternal Grandfather   . Emphysema Paternal Grandmother   . Breast cancer Maternal Grandmother   . Leukemia Maternal Grandmother   . Diabetes Maternal Grandmother   . Hypertension Maternal Grandmother   . Stroke Maternal Grandmother   . Heart attack Maternal Grandmother   . Cancer Maternal Grandfather        brain  . Panic disorder Father   . Hypertension Father   . Stroke Father   . Diabetes Father   . Other Father        heart issues  . Heart disease Father   . Hyperlipidemia Father   . Panic disorder Mother   . Hypertension Mother   . Anxiety disorder Sister   . Panic disorder Sister   . Hypertension Son       Allergies: No Known Allergies  Medications:       Current Outpatient Medications:  .  ALPRAZolam (XANAX) 1 MG tablet, Take 1 mg by mouth 3 (three) times daily., Disp: , Rfl:  .  DULoxetine (CYMBALTA) 30 MG capsule, Take 1 capsule (30 mg total) by mouth daily., Disp: 30 capsule, Rfl: 3 .  ibuprofen (ADVIL,MOTRIN) 800 MG tablet, Take 1 tablet (800 mg total) by mouth 3 (three) times daily. (Patient taking differently: Take 800 mg by mouth as needed. ), Disp: 21 tablet, Rfl: 0 .  Iron-FA-B Cmp-C-Biot-Probiotic (FUSION PLUS) CAPS, Take 1 capsule by mouth daily., Disp: 100 capsule, Rfl: 3 .  megestrol (MEGACE) 40 MG tablet, TAKE 3 FOR 5 DAYS THEN 2 FOR 5 DAYS THEN 1 DAILY, Disp: 45 tablet, Rfl: 1  Review of Systems:   Review of Systems  Constitutional: Negative for fever, chills, weight loss, malaise/fatigue and diaphoresis.  HENT: Negative for hearing loss, ear pain, nosebleeds, congestion, sore throat, neck pain, tinnitus and ear discharge.   Eyes: Negative for blurred vision, double vision, photophobia, pain, discharge and redness.  Respiratory: Negative for cough, hemoptysis, sputum production, shortness of breath, wheezing and stridor.   Cardiovascular: Negative for chest pain, palpitations, orthopnea, claudication, leg swelling and PND.  Gastrointestinal: Positive for abdominal pain. Negative for heartburn, nausea, vomiting, diarrhea, constipation, blood in stool and melena.  Genitourinary: Negative for dysuria, urgency, frequency, hematuria and flank pain.  Musculoskeletal: Negative for myalgias, back pain, joint pain and falls.  Skin: Negative for itching and rash.  Neurological: Negative for dizziness, tingling, tremors, sensory change, speech change, focal weakness, seizures, loss of consciousness, weakness and headaches.  Endo/Heme/Allergies: Negative for environmental allergies and polydipsia. Does not bruise/bleed easily.  Psychiatric/Behavioral: Negative for depression,  suicidal ideas, hallucinations, memory loss and substance abuse. The patient is not nervous/anxious and does not have insomnia.      PHYSICAL EXAM:  Blood pressure 112/72, pulse 73, height 5\' 8"  (1.727 m), weight 165 lb (74.8 kg).    Vitals reviewed. Constitutional: She is oriented to person, place, and time. She appears well-developed and well-nourished.  HENT:  Head: Normocephalic and atraumatic.  Right Ear: External ear normal.  Left Ear: External ear normal.  Nose: Nose normal.  Mouth/Throat: Oropharynx is clear and moist.  Eyes: Conjunctivae and EOM are normal. Pupils are equal, round, and reactive to light. Right eye exhibits no discharge. Left eye exhibits no discharge. No scleral icterus.  Neck: Normal range of motion. Neck supple. No tracheal deviation present. No thyromegaly present.  Cardiovascular: Normal rate, regular rhythm, normal heart sounds and intact distal pulses.  Exam reveals no gallop and no friction rub.   No murmur heard. Respiratory: Effort normal and breath sounds normal. No respiratory distress. She has no wheezes. She has no rales. She exhibits no tenderness.  GI: Soft. Bowel sounds are normal. She exhibits no distension and no mass. There is tenderness. There is no rebound and no guarding.  Genitourinary:       Vulva is normal without lesions Vagina is pink moist without discharge Cervix normal in appearance and pap is normal Uterus is normal size, contour, position, consistency, mobility, non-tender Adnexa is negative with normal sized ovaries by sonogram  Musculoskeletal: Normal range of motion. She exhibits no edema and no tenderness.  Neurological: She is alert and oriented to person, place, and time. She has normal reflexes. She displays normal reflexes. No cranial nerve deficit. She exhibits normal muscle tone. Coordination normal.  Skin: Skin is warm and dry. No rash noted. No erythema. No pallor.  Psychiatric: She has a normal mood and  affect. Her behavior is normal. Judgment and thought content normal.    Labs: Results for orders placed or performed during the hospital encounter of 04/01/18 (from the past 336 hour(s))  hCG, quantitative, pregnancy   Collection Time: 04/01/18  1:23 PM  Result Value Ref Range   hCG, Beta Chain, Quant, S <1 <5 mIU/mL  CBC   Collection Time: 04/01/18  1:23 PM  Result Value Ref Range   WBC 6.0 4.0 - 10.5 K/uL   RBC 4.79 3.87 - 5.11 MIL/uL   Hemoglobin 12.9 12.0 - 15.0 g/dL   HCT 16.1 09.6 - 04.5 %   MCV 82.9 80.0 - 100.0 fL   MCH 26.9 26.0 -  34.0 pg   MCHC 32.5 30.0 - 36.0 g/dL   RDW 65.7 (H) 84.6 - 96.2 %   Platelets 252 150 - 400 K/uL   nRBC 0.0 0.0 - 0.2 %  Results for orders placed or performed during the hospital encounter of 03/30/18 (from the past 336 hour(s))  CBC   Collection Time: 03/30/18  9:07 AM  Result Value Ref Range   WBC 5.3 4.0 - 10.5 K/uL   RBC 5.23 (H) 3.87 - 5.11 MIL/uL   Hemoglobin 13.5 12.0 - 15.0 g/dL   HCT 95.2 84.1 - 32.4 %   MCV 82.0 80.0 - 100.0 fL   MCH 25.8 (L) 26.0 - 34.0 pg   MCHC 31.5 30.0 - 36.0 g/dL   RDW 40.1 (H) 02.7 - 25.3 %   Platelets 266 150 - 400 K/uL   nRBC 0.0 0.0 - 0.2 %  Comprehensive metabolic panel   Collection Time: 03/30/18  9:07 AM  Result Value Ref Range   Sodium 136 135 - 145 mmol/L   Potassium 3.3 (L) 3.5 - 5.1 mmol/L   Chloride 106 98 - 111 mmol/L   CO2 22 22 - 32 mmol/L   Glucose, Bld 131 (H) 70 - 99 mg/dL   BUN 9 6 - 20 mg/dL   Creatinine, Ser 6.64 0.44 - 1.00 mg/dL   Calcium 8.5 (L) 8.9 - 10.3 mg/dL   Total Protein 7.4 6.5 - 8.1 g/dL   Albumin 3.9 3.5 - 5.0 g/dL   AST 19 15 - 41 U/L   ALT 14 0 - 44 U/L   Alkaline Phosphatase 38 38 - 126 U/L   Total Bilirubin 0.4 0.3 - 1.2 mg/dL   GFR calc non Af Amer >60 >60 mL/min   GFR calc Af Amer >60 >60 mL/min   Anion gap 8 5 - 15  hCG, quantitative, pregnancy   Collection Time: 03/30/18  9:07 AM  Result Value Ref Range   hCG, Beta Chain, Quant, S <1 <5 mIU/mL   Rapid HIV screen (HIV 1/2 Ab+Ag)   Collection Time: 03/30/18  9:07 AM  Result Value Ref Range   HIV-1 P24 Antigen - HIV24 NON REACTIVE NON REACTIVE   HIV 1/2 Antibodies NON REACTIVE NON REACTIVE   Interpretation (HIV Ag Ab)      A non reactive test result means that HIV 1 or HIV 2 antibodies and HIV 1 p24 antigen were not detected in the specimen.         Results for orders placed or performed in visit on 03/07/18 (from the past 336 hour(s))  POCT hemoglobin   Collection Time: 03/07/18  3:21 PM  Result Value Ref Range   Hemoglobin 13.6 12.2 - 16.2 g/dL    EKG:    Orders placed or performed during the hospital encounter of 11/17/17  . ED EKG  . ED EKG  . EKG 12-Lead  . EKG 12-Lead  . EKG    Imaging Studies: US Pelvis Transvanginal Non-ob (tv Only)  Result Date: 01/05/2018 CLINICAL DATA:  Dysfunctional uterine bleeding EXAM: TRANSABDOMINAL AND TRANSVAGINAL ULTRASOUND OF PELVIS TECHNIQUE: Both transabdominal and transvaginal ultrasound examinations of the pelvis were performed. Transabdominal technique was performed for global imaging of the pelvis including uterus, ovaries, adnexal regions, and pelvic cul-de-sac. It was necessary to proceed with endovaginal exam following the transabdominal exam to visualize the uterus, endometrium, ovaries and adnexa. COMPARISON:  10/06/2006 FINDINGS: Uterus Measurements: 7.3 x 5.3 x 5.3 cm, retroverted. No fibroids or other mass visualized. Endometrium Thickness:  27 mm in thickness, heterogeneous. No focal abnormality visualized. Right ovary Measurements: Not visualized due to bowel gas. Normal appearance/no adnexal mass. Left ovary Measurements: 3.4 x 2.7 x 2.8 cm. Small cystic area within the left ovary measures up to 3 cm, likely dominant follicle or small functional cyst. Other findings No abnormal free fluid. IMPRESSION: Markedly thickened and heterogeneous endometrium measuring up to 27 mm. If bleeding remains unresponsive to hormonal or  medical therapy, focal lesion work-up with sonohysterogram should be considered. Endometrial biopsy should also be considered in pre-menopausal patients at high risk for endometrial carcinoma. (Ref: Radiological Reasoning: Algorithmic Workup of Abnormal Vaginal Bleeding with Endovaginal Sonography and Sonohysterography. AJR 2008; 098:J19-14) Electronically Signed   By: Charlett Nose M.D.   On: 01/05/2018 14:28   US Pelvis (transabdominal Only)  Result Date: 01/05/2018 CLINICAL DATA:  Dysfunctional uterine bleeding EXAM: TRANSABDOMINAL AND TRANSVAGINAL ULTRASOUND OF PELVIS TECHNIQUE: Both transabdominal and transvaginal ultrasound examinations of the pelvis were performed. Transabdominal technique was performed for global imaging of the pelvis including uterus, ovaries, adnexal regions, and pelvic cul-de-sac. It was necessary to proceed with endovaginal exam following the transabdominal exam to visualize the uterus, endometrium, ovaries and adnexa. COMPARISON:  10/06/2006 FINDINGS: Uterus Measurements: 7.3 x 5.3 x 5.3 cm, retroverted. No fibroids or other mass visualized. Endometrium Thickness: 27 mm in thickness, heterogeneous. No focal abnormality visualized. Right ovary Measurements: Not visualized due to bowel gas. Normal appearance/no adnexal mass. Left ovary Measurements: 3.4 x 2.7 x 2.8 cm. Small cystic area within the left ovary measures up to 3 cm, likely dominant follicle or small functional cyst. Other findings No abnormal free fluid. IMPRESSION: Markedly thickened and heterogeneous endometrium measuring up to 27 mm. If bleeding remains unresponsive to hormonal or medical therapy, focal lesion work-up with sonohysterogram should be considered. Endometrial biopsy should also be considered in pre-menopausal patients at high risk for endometrial carcinoma. (Ref: Radiological Reasoning: Algorithmic Workup of Abnormal Vaginal Bleeding with Endovaginal Sonography and Sonohysterography. AJR 2008; 782:N56-21)  Electronically Signed   By: Charlett Nose M.D.   On: 01/05/2018 14:28     Assessment: Menorrhagia with regular cycle  Iron deficiency anemia due to chronic blood loss        Patient Active Problem List   Diagnosis Date Noted  . Menorrhagia with regular cycle 03/07/2018  . Iron deficiency anemia due to chronic blood loss 01/09/2018  . Low serum ferritin level 01/04/2018  . Encounter for well woman exam with routine gynecological exam 01/03/2018  . DUB (dysfunctional uterine bleeding) 01/03/2018  . Anemia 01/03/2018  . Screening for colorectal cancer 01/03/2018  . Depression, recurrent (HCC) 07/15/2016  . Panic disorder 07/15/2016  . Fatty liver 07/15/2016  . Benzodiazepine dependence (HCC) 07/15/2016  . Psoriasis 07/15/2016  . Tobacco abuse 07/15/2016    Plan: Hysteroscopy uterine curettage Minerva endometrial ablation 04/05/2018  Lazaro Arms 03/20/2018 10:53 AM

## 2018-04-05 NOTE — Op Note (Signed)
Preoperative diagnosis:  1.   Menorrhagia with regular cycle                                         2.  Anemia due to menorrhagia, resolved with mgestrol therapy   Postoperative diagnoses: Same as above   Procedure: Hysteroscopy, uterine curettage, endometrial ablation using Minerva  Surgeon: Lazaro Arms   Anesthesia: Laryngeal mask airway  Findings: The endometrium was normal. There were no fibroid or other abnormalities.  Description of operation: The patient was taken to the operating room and placed in the supine position. She underwent general anesthesia using the laryngeal mask airway. She was placed in the dorsal lithotomy position and prepped and draped in the usual sterile fashion. A Graves speculum was placed and the anterior cervical lip was grasped with a single-tooth tenaculum. The cervix was dilated serially to allow passage of the hysteroscope. Diagnostic hysteroscopy was performed and was found to be normal. A vigorous uterine curettage was then performed and all tissue sent to pathology for evaluation.  I then proceeded to perform the Minerva endometrial ablation.   The uterus sounded to 9.5 cm The handpiece was attached to the Minerva power source/machine and the handpiece passed the checklist. The array was squeezed down to remove all of the air present.  The array was then place into the endometrial cavity and deployed to a length of 6.5 cm. The handpiece confirmed appropriate width by being in the green portion of the visual dial. The cervical cuff was then inflated to the point the CO2 indicator was in the green. The endometrial integrity check was then performed and integrity sequence was confirmed x 2. The heating was then begun and carried out for a total of 2 minutes(which is standard therapy time). When the plasma cycle was finished,  the cervical cuff was deflated and the array was removed with tissue present on the silicon membrane. There was appropriate post  Minerva bleeding and uterine discharge.     All of the equipment worked well throughout the procedure.  The patient was awakened from anesthesia and taken to the recovery room in good stable condition all counts were correct. She received 2 g of Ancef and 30 mg of Toradol preoperatively. She will be discharged from the recovery room and followed up in the office in 1- 2 weeks.   She can expect 4 weeks of post procedure bloody watery discharge  Lazaro Arms, MD  04/05/2018 1:52 PM

## 2018-04-05 NOTE — Discharge Instructions (Signed)
Endometrial Ablation °Endometrial ablation is a procedure that destroys the thin inner layer of the lining of the uterus (endometrium). This procedure may be done: °· To stop heavy periods. °· To stop bleeding that is causing anemia. °· To control irregular bleeding. °· To treat bleeding caused by small tumors (fibroids) in the endometrium. ° °This procedure is often an alternative to major surgery, such as removal of the uterus and cervix (hysterectomy). As a result of this procedure: °· You may not be able to have children. However, if you are premenopausal (you have not gone through menopause): °? You may still have a small chance of getting pregnant. °? You will need to use a reliable method of birth control after the procedure to prevent pregnancy. °· You may stop having a menstrual period, or you may have only a small amount of bleeding during your period. Menstruation may return several years after the procedure. ° °Tell a health care provider about: °· Any allergies you have. °· All medicines you are taking, including vitamins, herbs, eye drops, creams, and over-the-counter medicines. °· Any problems you or family members have had with the use of anesthetic medicines. °· Any blood disorders you have. °· Any surgeries you have had. °· Any medical conditions you have. °What are the risks? °Generally, this is a safe procedure. However, problems may occur, including: °· A hole (perforation) in the uterus or bowel. °· Infection of the uterus, bladder, or vagina. °· Bleeding. °· Damage to other structures or organs. °· An air bubble in the lung (air embolus). °· Problems with pregnancy after the procedure. °· Failure of the procedure. °· Decreased ability to diagnose cancer in the endometrium. ° °What happens before the procedure? °· You will have tests of your endometrium to make sure there are no pre-cancerous cells or cancer cells present. °· You may have an ultrasound of the uterus. °· You may be given  medicines to thin the endometrium. °· Ask your health care provider about: °? Changing or stopping your regular medicines. This is especially important if you take diabetes medicines or blood thinners. °? Taking medicines such as aspirin and ibuprofen. These medicines can thin your blood. Do not take these medicines before your procedure if your doctor tells you not to. °· Plan to have someone take you home from the hospital or clinic. °What happens during the procedure? °· You will lie on an exam table with your feet and legs supported as in a pelvic exam. °· To lower your risk of infection: °? Your health care team will wash or sanitize their hands and put on germ-free (sterile) gloves. °? Your genital area will be washed with soap. °· An IV tube will be inserted into one of your veins. °· You will be given a medicine to help you relax (sedative). °· A surgical instrument with a light and camera (resectoscope) will be inserted into your vagina and moved into your uterus. This allows your surgeon to see inside your uterus. °· Endometrial tissue will be removed using one of the following methods: °? Radiofrequency. This method uses a radiofrequency-alternating electric current to remove the endometrium. °? Cryotherapy. This method uses extreme cold to freeze the endometrium. °? Heated-free liquid. This method uses a heated saltwater (saline) solution to remove the endometrium. °? Microwave. This method uses high-energy microwaves to heat up the endometrium and remove it. °? Thermal balloon. This method involves inserting a catheter with a balloon tip into the uterus. The balloon tip is   filled with heated fluid to remove the endometrium. °The procedure may vary among health care providers and hospitals. °What happens after the procedure? °· Your blood pressure, heart rate, breathing rate, and blood oxygen level will be monitored until the medicines you were given have worn off. °· As tissue healing occurs, you may  notice vaginal bleeding for 4-6 weeks after the procedure. You may also experience: °? Cramps. °? Thin, watery vaginal discharge that is light pink or brown in color. °? A need to urinate more frequently than usual. °? Nausea. °· Do not drive for 24 hours if you were given a sedative. °· Do not have sex or insert anything into your vagina until your health care provider approves. °Summary °· Endometrial ablation is done to treat the many causes of heavy menstrual bleeding. °· The procedure may be done only after medications have been tried to control the bleeding. °· Plan to have someone take you home from the hospital or clinic. °This information is not intended to replace advice given to you by your health care provider. Make sure you discuss any questions you have with your health care provider. °Document Released: 04/09/2004 Document Revised: 06/17/2016 Document Reviewed: 06/17/2016 °Elsevier Interactive Patient Education © 2017 [employer] ° ° ° ° °General Anesthesia, Adult, Care After °These instructions provide you with information about caring for yourself after your procedure. Your health care provider may also give you more specific instructions. Your treatment has been planned according to current medical practices, but problems sometimes occur. Call your health care provider if you have any problems or questions after your procedure. °What can I expect after the procedure? °After the procedure, it is common to have: °· Vomiting. °· A sore throat. °· Mental slowness. ° °It is common to feel: °· Nauseous. °· Cold or shivery. °· Sleepy. °· Tired. °· Sore or achy, even in parts of your body where you did not have surgery. ° °Follow these instructions at home: °For at least 24 hours after the procedure: °· Do not: °? Participate in activities where you could fall or become injured. °? Drive. °? Use heavy machinery. °? Drink alcohol. °? Take sleeping pills or medicines that cause drowsiness. °? Make  important decisions or sign legal documents. °? Take care of children on your own. °· Rest. °Eating and drinking °· If you vomit, drink water, juice, or soup when you can drink without vomiting. °· Drink enough fluid to keep your urine clear or pale yellow. °· Make sure you have little or no nausea before eating solid foods. °· Follow the diet recommended by your health care provider. °General instructions °· Have a responsible adult stay with you until you are awake and alert. °· Return to your normal activities as told by your health care provider. Ask your health care provider what activities are safe for you. °· Take over-the-counter and prescription medicines only as told by your health care provider. °· If you smoke, do not smoke without supervision. °· Keep all follow-up visits as told by your health care provider. This is important. °Contact a health care provider if: °· You continue to have nausea or vomiting at home, and medicines are not helpful. °· You cannot drink fluids or start eating again. °· You cannot urinate after 8-12 hours. °· You develop a skin rash. °· You have fever. °· You have increasing redness at the site of your procedure. °Get help right away if: °· You have difficulty breathing. °· You have chest   pain. °· You have unexpected bleeding. °· You feel that you are having a life-threatening or urgent problem. °This information is not intended to replace advice given to you by your health care provider. Make sure you discuss any questions you have with your health care provider. °Document Released: 09/06/2000 Document Revised: 11/03/2015 Document Reviewed: 05/15/2015 °Elsevier Interactive Patient Education © 2018 [employer] ° ° ° °

## 2018-04-06 ENCOUNTER — Encounter (HOSPITAL_COMMUNITY): Payer: Self-pay | Admitting: Obstetrics & Gynecology

## 2018-04-13 ENCOUNTER — Encounter: Payer: Self-pay | Admitting: Obstetrics & Gynecology

## 2018-04-13 ENCOUNTER — Ambulatory Visit: Payer: BLUE CROSS/BLUE SHIELD | Admitting: Obstetrics & Gynecology

## 2018-04-13 VITALS — BP 108/71 | HR 76 | Ht 69.0 in | Wt 168.0 lb

## 2018-04-13 DIAGNOSIS — Z9889 Other specified postprocedural states: Secondary | ICD-10-CM | POA: Diagnosis not present

## 2018-04-13 NOTE — Progress Notes (Signed)
  HPI: Patient returns for routine postoperative follow-up having undergone hysteroscopy uterine curettage endometrial ablation on 04/05/2018.  The patient's immediate postoperative recovery has been unremarkable. Since hospital discharge the patient reports cramping.   Current Outpatient Medications: ALPRAZolam (XANAX) 1 MG tablet, Take 1 mg by mouth 3 (three) times daily., Disp: , Rfl:  DULoxetine (CYMBALTA) 30 MG capsule, Take 1 capsule (30 mg total) by mouth daily., Disp: 30 capsule, Rfl: 3 ketorolac (TORADOL) 10 MG tablet, Take 1 tablet (10 mg total) by mouth every 8 (eight) hours as needed., Disp: 15 tablet, Rfl: 0 triamcinolone ointment (KENALOG) 0.5 %, Apply 1 application topically 2 (two) times daily., Disp: 30 g, Rfl: 1  No current facility-administered medications for this visit.     Blood pressure 108/71, pulse 76, height 5\' 9"  (1.753 m), weight 168 lb (76.2 kg).  Physical Exam: Normal post ablation  Diagnostic Tests:   Pathology: Benign endometrium  Impression: S/p Hysteroscopy uterine curettage endometrial ablation 04/05/2018  Plan: No sex for 3 weeks  Follow up: 1  years  Lazaro Arms, MD

## 2018-04-24 ENCOUNTER — Telehealth: Payer: Self-pay | Admitting: Obstetrics & Gynecology

## 2018-04-24 MED ORDER — IBUPROFEN 800 MG PO TABS: 800.0000 mg | ORAL_TABLET | Freq: Three times a day (TID) | ORAL | 0 refills | Status: AC | PRN

## 2018-04-24 NOTE — Telephone Encounter (Signed)
Pt called stating that after working Friday night she experienced some bad abdominal pain/cramping. She states that when she woke up Sat morning she experienced a large gush of blood and has been bleeding since then. She states that she is still having cramping and bleeding. Advised that some bleeding could be expected post ablation. Advised that Dr Despina Hidden would send something for pain and she could check with her pharmacy later today. Pt verbalized understanding.

## 2018-04-24 NOTE — Telephone Encounter (Signed)
done

## 2018-04-24 NOTE — Telephone Encounter (Signed)
Pt called stating that she has had an ablation done not to long ago and she has started bleeding again. Pt would like to know if she could speak with a nurse about it. Please contact pt

## 2018-04-25 NOTE — Telephone Encounter (Signed)
Meds ordered this encounter  Medications  . ibuprofen (ADVIL,MOTRIN) 800 MG tablet    Sig: Take 1 tablet (800 mg total) by mouth every 8 (eight) hours as needed.    Dispense:  30 tablet    Refill:  0

## 2018-05-17 ENCOUNTER — Other Ambulatory Visit: Payer: Self-pay | Admitting: Adult Health

## 2020-06-15 ENCOUNTER — Encounter: Payer: Self-pay | Admitting: Emergency Medicine

## 2020-06-15 ENCOUNTER — Ambulatory Visit
Admission: EM | Admit: 2020-06-15 | Discharge: 2020-06-15 | Disposition: A | Discharge status: Home / Self Care | Payer: Self-pay | Attending: Family Medicine | Admitting: Family Medicine

## 2020-06-15 DIAGNOSIS — I889 Nonspecific lymphadenitis, unspecified: Secondary | ICD-10-CM

## 2020-06-15 MED ORDER — CEPHALEXIN 500 MG PO CAPS: 500.0000 mg | ORAL_CAPSULE | Freq: Two times a day (BID) | ORAL | 0 refills | Status: DC

## 2020-06-15 NOTE — ED Triage Notes (Signed)
headache and swollen lymph nodes behind right ear x this morning.

## 2020-06-15 NOTE — Discharge Instructions (Signed)
Warm compreses Aleve (naproxen) or ibuprofen for pain Take antibiotic 2 times a day Call or return if not improving in 24 to 48 hours

## 2020-06-16 NOTE — ED Provider Notes (Signed)
Jennifer Greer    CSN: [id number] Arrival date & time: 06/15/20  1527      History   Chief Complaint No chief complaint on file.   HPI Jennifer Greer is a 49 y.o. female.   HPI  Jennifer Greer has a bump behind her ear.  It swollen and painful.  Its been present just since this morning.  She has no ear infection.  No cough cold or runny nose.  No fever or chills.  No new products on her hair, no lesion on her scalp.  States she does have a history of psoriasis but this is longstanding.  Past Medical History:  Diagnosis Date  . Anemia   . Anxiety   . Depression   . Fatty liver   . Fatty liver   . Mental disorder   . Panic attack   . Substance abuse [facility])     Patient Active Problem List   Diagnosis Date Noted  . Menorrhagia with regular cycle 03/07/2018  . Iron deficiency anemia due to chronic blood loss 01/09/2018  . Low serum ferritin level 01/04/2018  . Encounter for well woman exam with routine gynecological exam 01/03/2018  . DUB (dysfunctional uterine bleeding) 01/03/2018  . Anemia 01/03/2018  . Screening for colorectal cancer 01/03/2018  . Depression, recurrent ([place]) 07/15/2016  . Panic disorder 07/15/2016  . Fatty liver 07/15/2016  . Benzodiazepine dependence ([place]) 07/15/2016  . Psoriasis 07/15/2016  . Tobacco abuse 07/15/2016    Past Surgical History:  Procedure Laterality Date  . BACK SURGERY    . DILITATION & CURRETTAGE/HYSTROSCOPY WITH NOVASURE ABLATION N/A 04/05/2018   Procedure: DILATATION & CURETTAGE/HYSTEROSCOPY WITH MINERVA  ABLATION;  Surgeon: Florian Buff, MD;  Location: [facility];  Service: Gynecology;  Laterality: N/A;  . rotar cuff repair    . SPINE SURGERY    . TUBAL LIGATION      OB History    Gravida  5   Para  3   Term  3   Preterm      AB  2   Living  3     SAB      IAB      Ectopic      Multiple      Live Births  3            Home Medications    Prior to Admission medications   Medication Sig Start Date  End Date Taking? Authorizing Provider  cephALEXin (KEFLEX) 500 MG capsule Take 1 capsule (500 mg total) by mouth 2 (two) times daily. 06/15/20  Yes Raylene Everts, MD  ALPRAZolam Duanne Moron) 1 MG tablet Take 1 mg by mouth 3 (three) times daily.    [provider]  DULoxetine (CYMBALTA) 30 MG capsule TAKE 1 CAPSULE BY MOUTH EVERY DAY 05/17/18   Derrek Monaco A, NP  ibuprofen (ADVIL,MOTRIN) 800 MG tablet Take 1 tablet (800 mg total) by mouth every 8 (eight) hours as needed. 04/24/18   Florian Buff, MD  ketorolac (TORADOL) 10 MG tablet Take 1 tablet (10 mg total) by mouth every 8 (eight) hours as needed. 04/05/18   Florian Buff, MD  triamcinolone ointment (KENALOG) 0.5 % Apply 1 application topically 2 (two) times daily. 04/01/18   Noemi Chapel, MD    Family History Family History  Problem Relation Age of Onset  . Emphysema Paternal Grandfather   . Emphysema Paternal Grandmother   . Breast cancer Maternal Grandmother   . Leukemia Maternal Grandmother   .  Diabetes Maternal Grandmother   . Hypertension Maternal Grandmother   . Stroke Maternal Grandmother   . Heart attack Maternal Grandmother   . Cancer Maternal Grandfather        brain  . Panic disorder Father   . Hypertension Father   . Stroke Father   . Diabetes Father   . Other Father        heart issues  . Heart disease Father   . Hyperlipidemia Father   . Panic disorder Mother   . Hypertension Mother   . Anxiety disorder Sister   . Panic disorder Sister   . Hypertension Son     Social History Social History   Tobacco Use  . Smoking status: Former Smoker    Packs/day: 0.50    Years: 15.00    Pack years: 7.50    Types: Cigarettes    Start date: 06/14/1984  . Smokeless tobacco: Never Used  . Tobacco comment: DISCUSSED  Vaping Use  . Vaping Use: Never used  Substance Use Topics  . Alcohol use: Not Currently    Comment: occasionally  . Drug use: No     Allergies   Patient has no known  allergies.   Review of Systems Review of Systems See HPI  Physical Exam Triage Vital Signs ED Triage Vitals [06/15/20 1653]  Enc Vitals Group     BP 133/84     Pulse Rate 73     Resp 15     Temp 98 F (36.7 C)     Temp src      SpO2 98 %     Weight      Height      Head Circumference      Peak Flow      Pain Score      Pain Loc      Pain Edu?      Excl. in GC?    No data found.  Updated Vital Signs BP 133/84   Pulse 73   Temp 98 F (36.7 C)   Resp 15   SpO2 98%      Physical Exam Constitutional:      General: She is not in acute distress.    Appearance: She is well-developed, normal weight and well-nourished.  HENT:     Head: Normocephalic and atraumatic.      Comments: Posterior auricular node on the right is tender and enlarged.  Overlying skin is faintly erythematous    Right Ear: Tympanic membrane, ear canal and external ear normal.     Left Ear: Tympanic membrane, ear canal and external ear normal.     Mouth/Throat:     Mouth: Oropharynx is clear and moist.  Eyes:     Conjunctiva/sclera: Conjunctivae normal.     Pupils: Pupils are equal, round, and reactive to light.  Cardiovascular:     Rate and Rhythm: Normal rate.  Pulmonary:     Effort: Pulmonary effort is normal. No respiratory distress.  Abdominal:     General: There is no distension.     Palpations: Abdomen is soft.  Musculoskeletal:        General: No edema. Normal range of motion.     Cervical back: Normal range of motion.  Skin:    General: Skin is warm and dry.  Neurological:     Mental Status: She is alert.      UC Treatments / Results  Labs (all labs ordered are listed, but only abnormal results are displayed) Labs  Reviewed - No data to display  EKG   Radiology No results found.  Procedures Procedures (including critical care time)  Medications Ordered in UC Medications - No data to display  Initial Impression / Assessment and Plan / UC Course  I have reviewed  the triage vital signs and the nursing notes.  Pertinent labs & imaging results that were available during my care of the patient were reviewed by me and considered in my medical decision making (see chart for details).     Do not see a reason for her lymphadenitis but clearly it is a lymph node involved.  Will treat with antibiotics and follow-up.  Return if fails to improve Final Clinical Impressions(s) / UC Diagnoses   Final diagnoses:  Lymphadenitis     Discharge Instructions     Warm compreses Aleve (naproxen) or ibuprofen for pain Take antibiotic 2 times a day Call or return if not improving in 24 to 48 hours   ED Prescriptions    Medication Sig Dispense Auth. Provider   cephALEXin (KEFLEX) 500 MG capsule Take 1 capsule (500 mg total) by mouth 2 (two) times daily. 10 capsule Eustace Moore, MD     PDMP not reviewed this encounter.   Eustace Moore, MD 06/16/20 [id number]

## 2020-06-19 IMAGING — US US TRANSVAGINAL NON-OB
1 series · 13 of 25 positions shown · non-contrast
Comparison: 10/06/2006

CLINICAL DATA: Dysfunctional uterine bleeding



[Series 1: us transvaginal non-ob · 0.24mm/px · 13 of 64 slices shown]
[im 1/64]
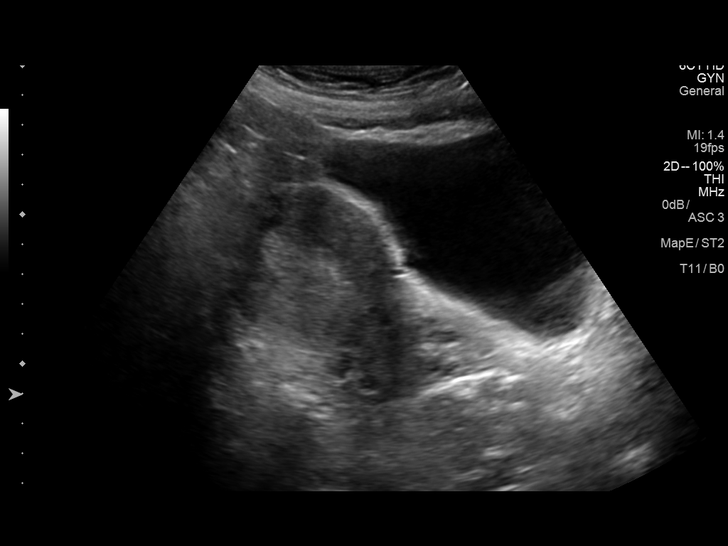
[im 6/64]
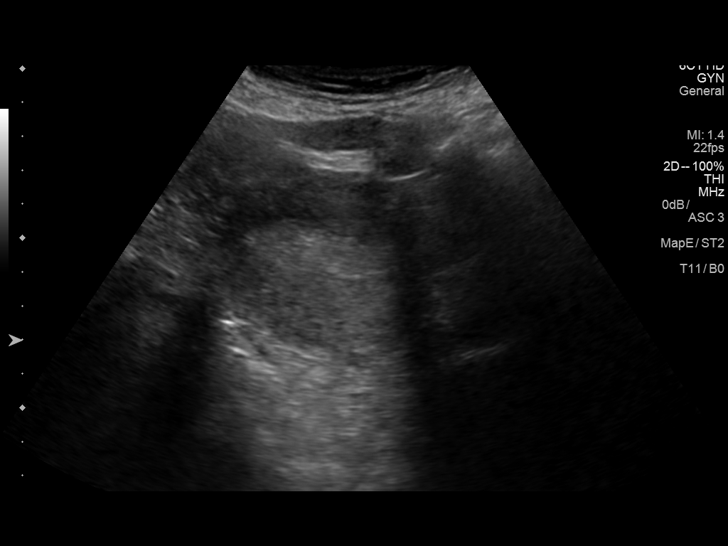
[im 11/64]
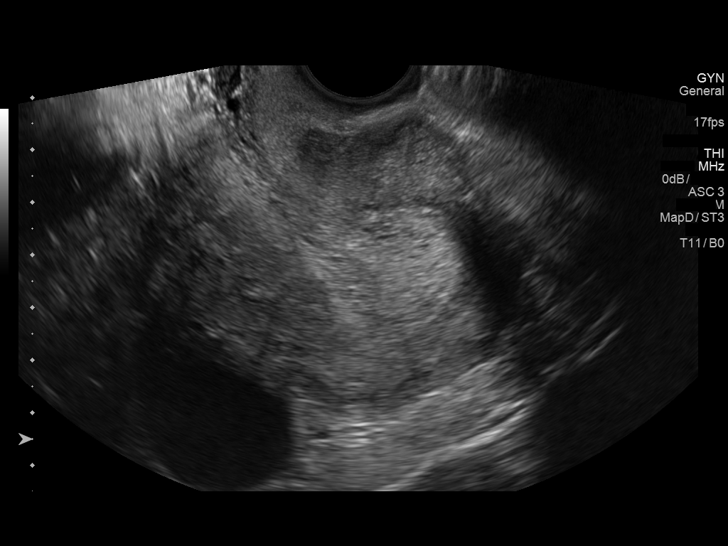
[im 16/64]
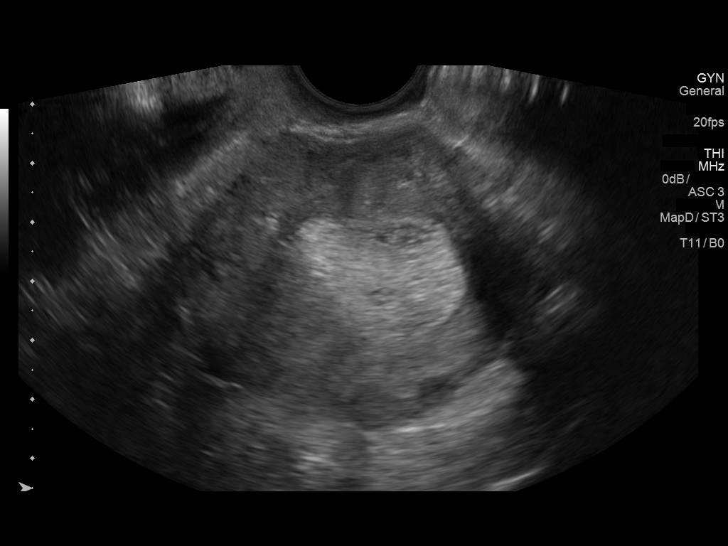
[im 22/64]
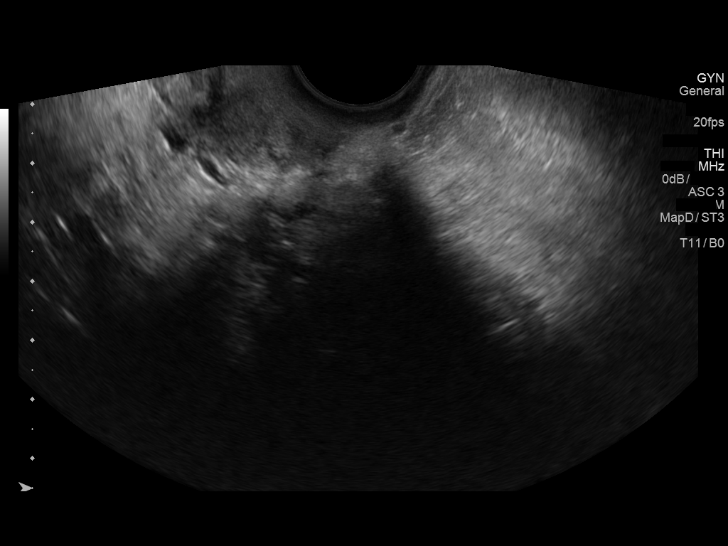
[im 27/64]
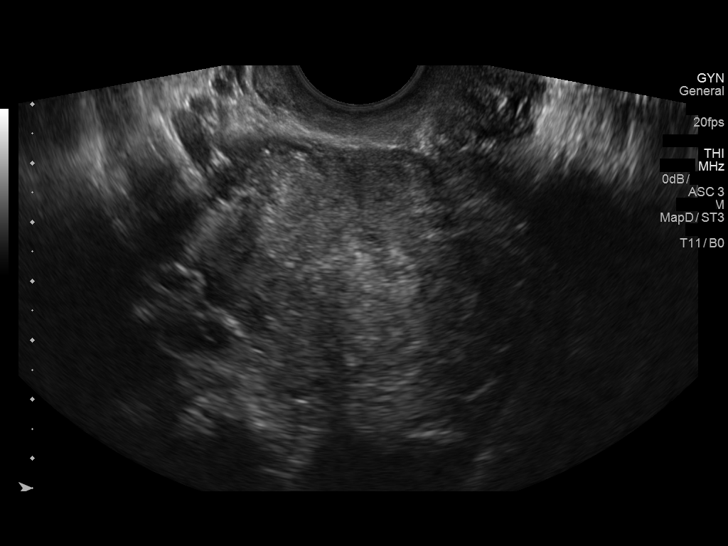
[im 32/64]
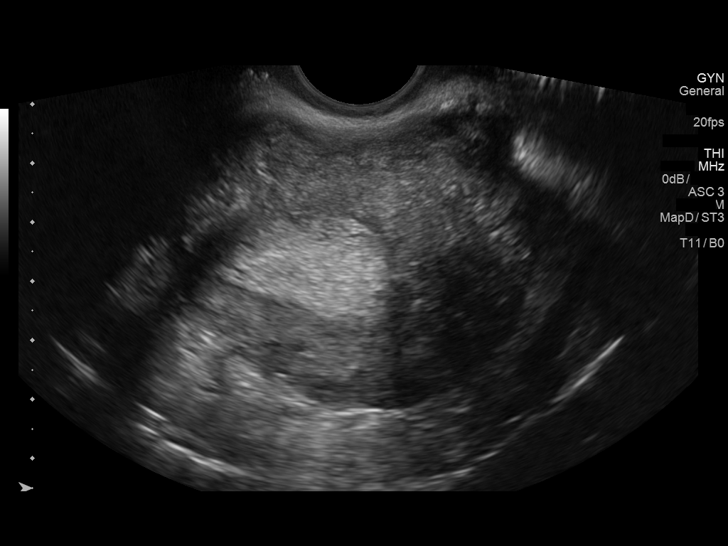
[im 37/64]
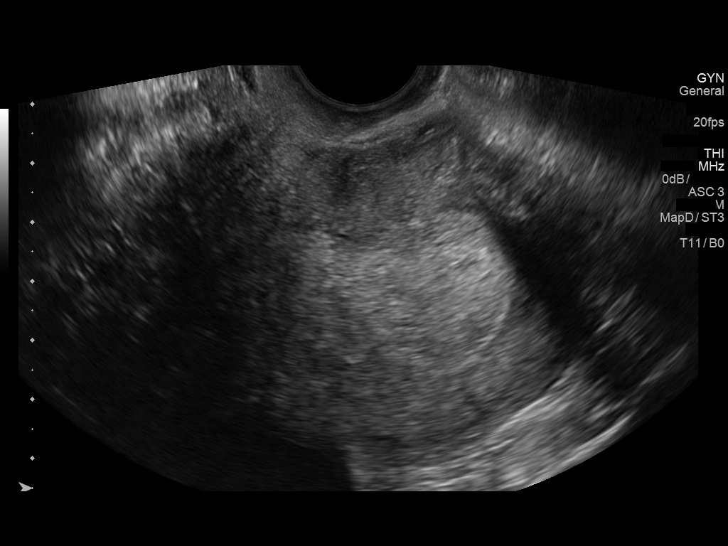
[im 43/64]
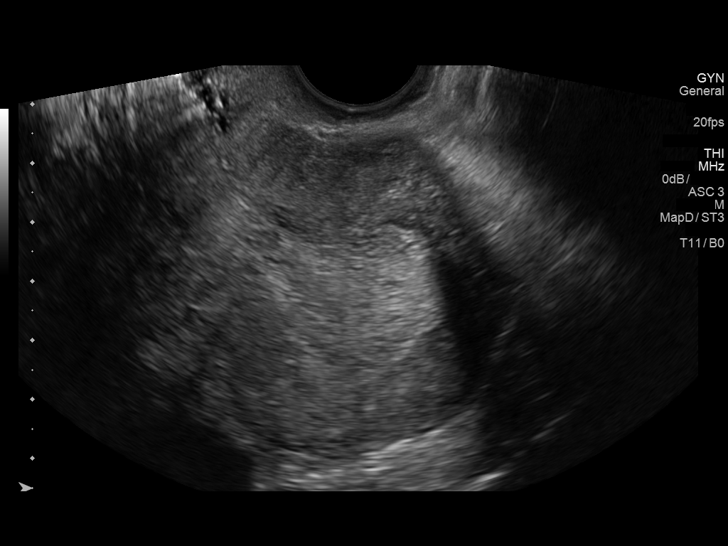
[im 48/64]
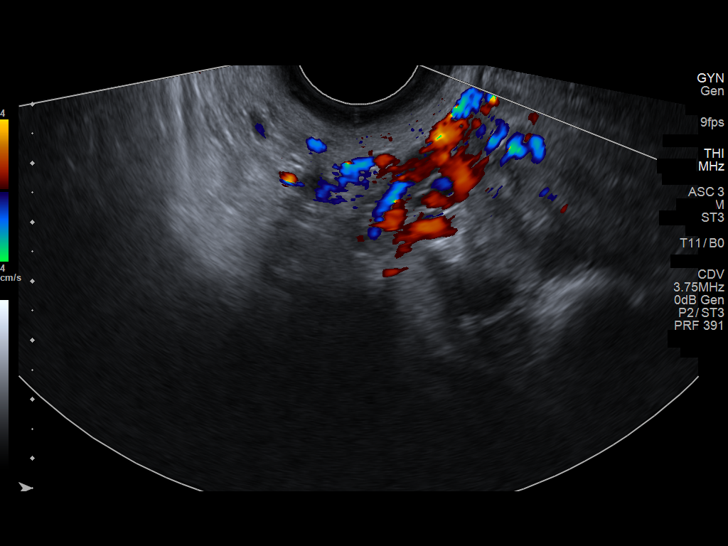
[im 53/64]
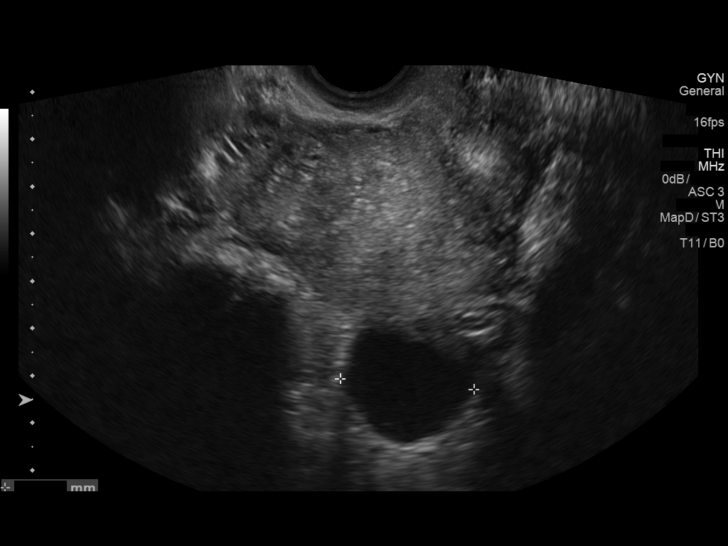
[im 58/64]
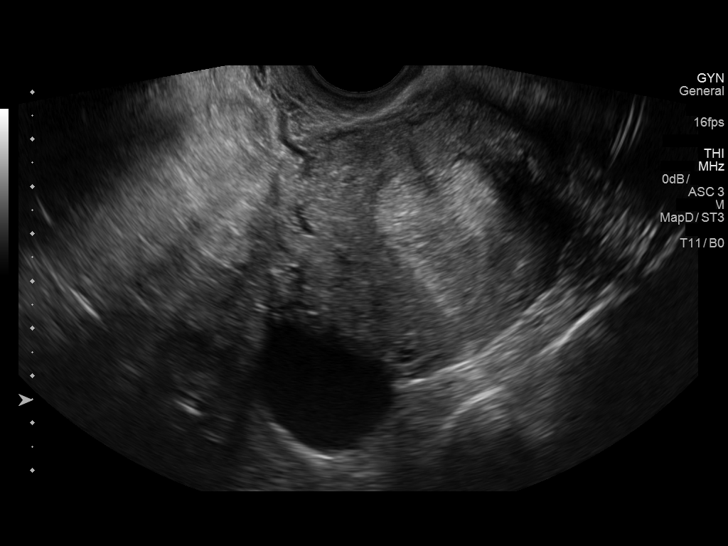
[im 64/64]
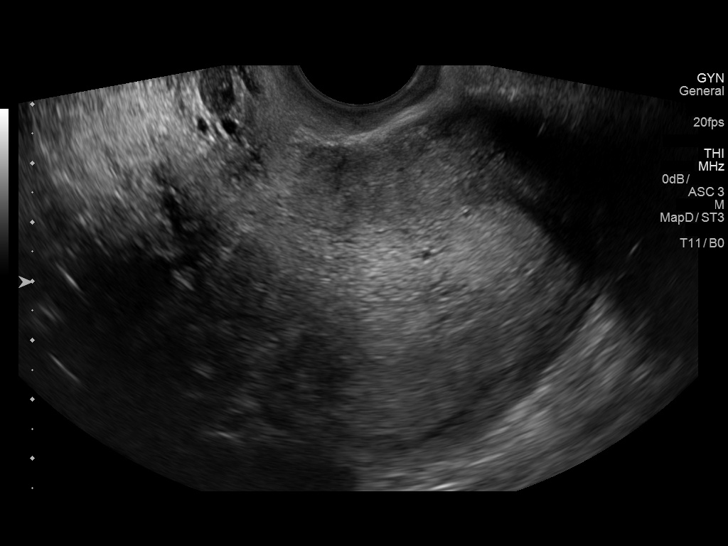

[13 of 25 positions shown; findings below may reference images not displayed]

FINDINGS: Uterus

Measurements: 7.3 x 5.3 x 5.3 cm, retroverted. No fibroids or other
mass visualized.

Endometrium

Thickness: 27 mm in thickness, heterogeneous. No focal abnormality
visualized.

Right ovary

Measurements: Not visualized due to bowel gas. Normal appearance/no
adnexal mass.

Left ovary

Measurements: 3.4 x 2.7 x 2.8 cm. Small cystic area within the left
ovary measures up to 3 cm, likely dominant follicle or small
functional cyst.

Other findings

No abnormal free fluid.
IMPRESSION: Markedly thickened and heterogeneous endometrium measuring up to 27
mm. If bleeding remains unresponsive to hormonal or medical therapy,
focal lesion work-up with sonohysterogram should be considered.
Endometrial biopsy should also be considered in pre-menopausal
patients at high risk for endometrial carcinoma. (Ref: Radiological
Reasoning: Algorithmic Workup of Abnormal Vaginal Bleeding with
Endovaginal Sonography and Sonohysterography. AJR [id number]; 191:[id number])

## 2021-06-24 ENCOUNTER — Other Ambulatory Visit: Payer: Self-pay

## 2021-06-24 ENCOUNTER — Ambulatory Visit
Admission: EM | Admit: 2021-06-24 | Discharge: 2021-06-24 | Disposition: A | Discharge status: Home / Self Care | Payer: Self-pay

## 2021-06-24 ENCOUNTER — Ambulatory Visit (INDEPENDENT_AMBULATORY_CARE_PROVIDER_SITE_OTHER): Payer: Self-pay

## 2021-06-24 DIAGNOSIS — M5412 Radiculopathy, cervical region: Secondary | ICD-10-CM

## 2021-06-24 DIAGNOSIS — M542 Cervicalgia: Secondary | ICD-10-CM

## 2021-06-24 MED ORDER — TIZANIDINE HCL 4 MG PO TABS: 4.0000 mg | ORAL_TABLET | Freq: Every day | ORAL | 0 refills | Status: DC

## 2021-06-24 MED ORDER — PREDNISONE 20 MG PO TABS: ORAL_TABLET | ORAL | 0 refills | Status: DC

## 2021-06-24 NOTE — Discharge Instructions (Addendum)
Please make sure they follow-up with the spine specialist given your history of a bulging disc.  For now, I am managing your severe neck pain, cervical radiculopathy with an oral steroid course for the next 9 days.  Do not use ibuprofen while you are on prednisone, the steroid.  It is okay for you to use tizanidine.  I will call you if your x-ray results show [place] any abnormal findings.  Remember that MRIs are better at seeing bulging disks and therefore it will be important for you to follow-up with spine specialist, this is not a chiropractor.  I provided you with information so you can call for consultation.  If your x-ray report is normal, then you will not get a call and your results will simply be available through MyChart.

## 2021-06-24 NOTE — ED Triage Notes (Signed)
Patient states that yesterday her back started hurting right at  the top of her spine.  Patient states that she tried ibuprofen, tylenol and an ice back  Patient states if she coughs, sneezes or take a deep breath it hurts  Patient states that lifting her head and turning it also hurts  Denies Injury

## 2021-06-24 NOTE — ED Provider Notes (Signed)
[facility]   MRN: [id number] DOB: 1970-07-30  Subjective:   Jennifer Greer is a 51 y.o. female presenting for 2-day history of acute onset persistent severe pain at the base of her neck that radiates toward the left side.  No fall, trauma, weakness, numbness or tingling.  MRI from 2009 shows a history of a bulging disc but she cannot recall ever being told about this.  No current facility-administered medications for this encounter.  Current Outpatient Medications:    ALPRAZolam (XANAX) 1 MG tablet, Take 1 mg by mouth 3 (three) times daily., Disp: , Rfl:    cephALEXin (KEFLEX) 500 MG capsule, Take 1 capsule (500 mg total) by mouth 2 (two) times daily., Disp: 10 capsule, Rfl: 0   DULoxetine (CYMBALTA) 30 MG capsule, TAKE 1 CAPSULE BY MOUTH EVERY DAY, Disp: 30 capsule, Rfl: 3   ibuprofen (ADVIL,MOTRIN) 800 MG tablet, Take 1 tablet (800 mg total) by mouth every 8 (eight) hours as needed., Disp: 30 tablet, Rfl: 0   ketorolac (TORADOL) 10 MG tablet, Take 1 tablet (10 mg total) by mouth every 8 (eight) hours as needed., Disp: 15 tablet, Rfl: 0   triamcinolone ointment (KENALOG) 0.5 %, Apply 1 application topically 2 (two) times daily., Disp: 30 g, Rfl: 1   No Known Allergies  Past Medical History:  Diagnosis Date   Anemia    Anxiety    Depression    Fatty liver    Fatty liver    Mental disorder    Panic attack    Substance abuse ([facility])      Past Surgical History:  Procedure Laterality Date   BACK SURGERY     DILITATION & CURRETTAGE/HYSTROSCOPY WITH NOVASURE ABLATION N/A 04/05/2018   Procedure: DILATATION & CURETTAGE/HYSTEROSCOPY WITH MINERVA  ABLATION;  Surgeon: Lazaro Arms, MD;  Location: [facility];  Service: Gynecology;  Laterality: N/A;   rotar cuff repair     SPINE SURGERY     TUBAL LIGATION      Family History  Problem Relation Age of Onset   Emphysema Paternal Grandfather    Emphysema Paternal Grandmother    Breast cancer Maternal Grandmother     Leukemia Maternal Grandmother    Diabetes Maternal Grandmother    Hypertension Maternal Grandmother    Stroke Maternal Grandmother    Heart attack Maternal Grandmother    Cancer Maternal Grandfather        brain   Panic disorder Father    Hypertension Father    Stroke Father    Diabetes Father    Other Father        heart issues   Heart disease Father    Hyperlipidemia Father    Panic disorder Mother    Hypertension Mother    Anxiety disorder Sister    Panic disorder Sister    Hypertension Son     Social History   Tobacco Use   Smoking status: Former    Packs/day: 0.50    Years: 15.00    Pack years: 7.50    Types: Cigarettes    Start date: 06/14/1984   Smokeless tobacco: Never   Tobacco comments:    DISCUSSED  Vaping Use   Vaping Use: Never used  Substance Use Topics   Alcohol use: Not Currently    Comment: occasionally   Drug use: No    ROS   Objective:   Vitals: BP 116/79 (BP Location: Right Arm)    Pulse 80    Temp 98.2 F (36.8  C) (Oral)    Resp 16    SpO2 98%   Physical Exam Constitutional:      General: She is not in acute distress.    Appearance: Normal appearance. She is well-developed. She is not ill-appearing, toxic-appearing or diaphoretic.  HENT:     Head: Normocephalic and atraumatic.     Nose: Nose normal.     Mouth/Throat:     Mouth: Mucous membranes are moist.  Eyes:     General: No scleral icterus.    Extraocular Movements: Extraocular movements intact.  Cardiovascular:     Rate and Rhythm: Normal rate.  Pulmonary:     Effort: Pulmonary effort is normal.  Musculoskeletal:     Cervical back: Spasms (over left trapezius), tenderness (at the base of the neck, positive Spurling maneuver, negative Lhermitte sign) and bony tenderness present. No swelling, edema, deformity, erythema, signs of trauma, lacerations, rigidity, torticollis or crepitus. Pain with movement present. Decreased range of motion.       Back:  Skin:    General: Skin  is warm and dry.  Neurological:     General: No focal deficit present.     Mental Status: She is alert and oriented to person, place, and time.     Motor: No weakness.     Coordination: Coordination normal.     Gait: Gait normal.  Psychiatric:        Mood and Affect: Mood normal.        Behavior: Behavior normal.    Assessment and Plan :   PDMP not reviewed this encounter.  1. Neck pain   2. Cervical radiculopathy    Given her cervical radiculopathy, severe neck pain I recommended an oral prednisone course. She is to use tizanidine with this as well.  Follow-up with the spine specialist as soon as possible. Counseled patient on potential for adverse effects with medications prescribed/recommended today, ER and return-to-clinic precautions discussed, patient verbalized understanding.  X-ray over-read was pending at time of discharge, recommended follow up with only abnormal results. Otherwise will not call for negative over-read. Patient was in agreement.    Wallis Bamberg, PA-C 06/24/21 1556

## 2021-09-04 DIAGNOSIS — R69 Illness, unspecified: Secondary | ICD-10-CM | POA: Diagnosis not present

## 2021-11-17 ENCOUNTER — Other Ambulatory Visit (HOSPITAL_COMMUNITY): Payer: Self-pay | Admitting: Adult Health Nurse Practitioner

## 2021-11-17 DIAGNOSIS — Z1231 Encounter for screening mammogram for malignant neoplasm of breast: Secondary | ICD-10-CM

## 2022-02-16 ENCOUNTER — Ambulatory Visit
Admission: EM | Admit: 2022-02-16 | Discharge: 2022-02-16 | Disposition: A | Discharge status: Home / Self Care | Payer: Self-pay | Attending: Nurse Practitioner | Admitting: Nurse Practitioner

## 2022-02-16 DIAGNOSIS — Z9189 Other specified personal risk factors, not elsewhere classified: Secondary | ICD-10-CM

## 2022-02-16 DIAGNOSIS — J069 Acute upper respiratory infection, unspecified: Secondary | ICD-10-CM | POA: Insufficient documentation

## 2022-02-16 DIAGNOSIS — Z20822 Contact with and (suspected) exposure to covid-19: Secondary | ICD-10-CM | POA: Insufficient documentation

## 2022-02-16 DIAGNOSIS — J309 Allergic rhinitis, unspecified: Secondary | ICD-10-CM | POA: Insufficient documentation

## 2022-02-16 DIAGNOSIS — R059 Cough, unspecified: Secondary | ICD-10-CM | POA: Insufficient documentation

## 2022-02-16 LAB — RESP PANEL BY RT-PCR (FLU A&B, COVID) ARPGX2
Influenza A by PCR: NEGATIVE
Influenza B by PCR: NEGATIVE
SARS Coronavirus 2 by RT PCR: NEGATIVE

## 2022-02-16 LAB — POCT RAPID STREP A (OFFICE): Rapid Strep A Screen: NEGATIVE

## 2022-02-16 MED ORDER — PROMETHAZINE-DM 6.25-15 MG/5ML PO SYRP: 5.0000 mL | ORAL_SOLUTION | Freq: Four times a day (QID) | ORAL | 0 refills | Status: DC | PRN

## 2022-02-16 MED ORDER — FLUTICASONE PROPIONATE 50 MCG/ACT NA SUSP: 2.0000 | Freq: Every day | NASAL | 0 refills | Status: AC

## 2022-02-16 MED ORDER — CETIRIZINE HCL 10 MG PO TABS: 10.0000 mg | ORAL_TABLET | Freq: Every day | ORAL | 0 refills | Status: AC

## 2022-02-16 NOTE — ED Triage Notes (Signed)
Pt reports cough, body aches, scratchy throat nasal congestion x 1 day. Pt reports she had diarrhea and fever yesterday. Motrin and Tylenol gives some relief.

## 2022-02-16 NOTE — Discharge Instructions (Addendum)
The rapid strep test was positive. A throat culture is pending. You will be contacted if the results are positive. As discussed if the COVID test is positive, you are a candidate to receive molnupiravir.  You will be contacted if your test results are positive. Take medication as prescribed. Increase fluids and allow for plenty of rest. Recommend Tylenol or ibuprofen as needed for pain, fever, or general discomfort. Warm salt water gargles 3-4 times daily to help with throat pain or discomfort. Recommend using a humidifier at bedtime during sleep to help with cough and nasal congestion. Sleep elevated on 2 pillows. Follow-up if your symptoms do not improve within the next 7 to 10 days.

## 2022-02-16 NOTE — ED Provider Notes (Signed)
[facility]    CSN: [id number] Arrival date & time: 02/16/22  1535      History   Chief Complaint Chief Complaint  Patient presents with   Sore Throat   Nausea   Fever    HPI Jennifer Greer is a 51 y.o. female.   The history is provided by the patient.   She presents with a 1 day history of sore throat, nausea, cough, nasal congestion, and fever.  Patient states fever was noticed because she felt hot and cold.  She states that she did have an episode of diarrhea and nausea.  States that the nausea remains present.  She denies headache, nasal congestion, ear pain, cough, shortness of breath, or abdominal pain.  Patient denies any known sick contacts.  States that she does have a history of smoking.  Patient states that she has received 3 COVID vaccines.  States she has been taking Tylenol for her symptoms.  Past Medical History:  Diagnosis Date   Anemia    Anxiety    Depression    Fatty liver    Fatty liver    Mental disorder    Panic attack    Substance abuse [facility])     Patient Active Problem List   Diagnosis Date Noted   Menorrhagia with regular cycle 03/07/2018   Iron deficiency anemia due to chronic blood loss 01/09/2018   Low serum ferritin level 01/04/2018   Encounter for well woman exam with routine gynecological exam 01/03/2018   DUB (dysfunctional uterine bleeding) 01/03/2018   Anemia 01/03/2018   Screening for colorectal cancer 01/03/2018   Depression, recurrent ([place]) 07/15/2016   Panic disorder 07/15/2016   Fatty liver 07/15/2016   Benzodiazepine dependence ([place]) 07/15/2016   Psoriasis 07/15/2016   Tobacco abuse 07/15/2016    Past Surgical History:  Procedure Laterality Date   BACK SURGERY     DILITATION & CURRETTAGE/HYSTROSCOPY WITH NOVASURE ABLATION N/A 04/05/2018   Procedure: DILATATION & CURETTAGE/HYSTEROSCOPY WITH MINERVA  ABLATION;  Surgeon: Florian Buff, MD;  Location: [facility];  Service: Gynecology;  Laterality: N/A;   rotar cuff  repair     SPINE SURGERY     TUBAL LIGATION      OB History     Gravida  5   Para  3   Term  3   Preterm      AB  2   Living  3      SAB      IAB      Ectopic      Multiple      Live Births  3            Home Medications    Prior to Admission medications   Medication Sig Start Date End Date Taking? Authorizing Provider  acetaminophen (TYLENOL) 500 MG tablet Take by mouth every 6 (six) hours as needed.   Yes [provider]  cetirizine (ZYRTEC) 10 MG tablet Take 1 tablet (10 mg total) by mouth daily. 02/16/22  Yes Ayden Apodaca-Warren, Alda Lea, NP  fluticasone (FLONASE) 50 MCG/ACT nasal spray Place 2 sprays into both nostrils daily. 02/16/22  Yes Sylas Twombly-Warren, Alda Lea, NP  promethazine-dextromethorphan (PROMETHAZINE-DM) 6.25-15 MG/5ML syrup Take 5 mLs by mouth 4 (four) times daily as needed for cough. 02/16/22  Yes Trusten Hume-Warren, Alda Lea, NP  ALPRAZolam Duanne Moron) 1 MG tablet Take 1 mg by mouth 3 (three) times daily.    [provider]  cephALEXin (KEFLEX) 500 MG capsule Take 1  capsule (500 mg total) by mouth 2 (two) times daily. 06/15/20   Eustace Moore, MD  DULoxetine (CYMBALTA) 30 MG capsule TAKE 1 CAPSULE BY MOUTH EVERY DAY 05/17/18   Adline Potter, NP  DULoxetine (CYMBALTA) 60 MG capsule Take by mouth. 04/01/21   [provider]  ibuprofen (ADVIL,MOTRIN) 800 MG tablet Take 1 tablet (800 mg total) by mouth every 8 (eight) hours as needed. 04/24/18   Lazaro Arms, MD  ketorolac (TORADOL) 10 MG tablet Take 1 tablet (10 mg total) by mouth every 8 (eight) hours as needed. 04/05/18   Lazaro Arms, MD  predniSONE (DELTASONE) 20 MG tablet Day 1-3: Take 3 tablets daily. Day 4-6: Take 2 tablets daily. Day 7-9: Take 1 tablet daily. Take tablets daily with breakfast. 06/24/21   Wallis Bamberg, PA-C  tiZANidine (ZANAFLEX) 4 MG tablet Take 1 tablet (4 mg total) by mouth at bedtime. 06/24/21   Wallis Bamberg, PA-C  triamcinolone ointment (KENALOG) 0.5 %  Apply 1 application topically 2 (two) times daily. 04/01/18   Eber Hong, MD    Family History Family History  Problem Relation Age of Onset   Emphysema Paternal Grandfather    Emphysema Paternal Grandmother    Breast cancer Maternal Grandmother    Leukemia Maternal Grandmother    Diabetes Maternal Grandmother    Hypertension Maternal Grandmother    Stroke Maternal Grandmother    Heart attack Maternal Grandmother    Cancer Maternal Grandfather        brain   Panic disorder Father    Hypertension Father    Stroke Father    Diabetes Father    Other Father        heart issues   Heart disease Father    Hyperlipidemia Father    Panic disorder Mother    Hypertension Mother    Anxiety disorder Sister    Panic disorder Sister    Hypertension Son     Social History Social History   Tobacco Use   Smoking status: Every Day    Packs/day: 0.50    Years: 15.00    Total pack years: 7.50    Types: Cigarettes    Start date: 06/14/1984   Smokeless tobacco: Never   Tobacco comments:    DISCUSSED  Vaping Use   Vaping Use: Never used  Substance Use Topics   Alcohol use: Not Currently    Comment: occasionally   Drug use: No     Allergies   Patient has no known allergies.   Review of Systems Review of Systems Per HPI  Physical Exam Triage Vital Signs ED Triage Vitals  Enc Vitals Group     BP 02/16/22 1633 125/84     Pulse Rate 02/16/22 1633 90     Resp 02/16/22 1633 18     Temp 02/16/22 1633 98 F (36.7 C)     Temp Source 02/16/22 1633 Oral     SpO2 02/16/22 1633 98 %     Weight --      Height --      Head Circumference --      Peak Flow --      Pain Score 02/16/22 1635 7     Pain Loc --      Pain Edu? --      Excl. in GC? --    No data found.  Updated Vital Signs BP 125/84 (BP Location: Right Arm)   Pulse 90   Temp 98 F (36.7 C) (Oral)  Resp 18   SpO2 98%   Visual Acuity Right Eye Distance:   Left Eye Distance:   Bilateral Distance:     Right Eye Near:   Left Eye Near:    Bilateral Near:     Physical Exam Vitals and nursing note reviewed.  Constitutional:      General: She is not in acute distress.    Appearance: She is well-developed.  HENT:     Head: Normocephalic.     Right Ear: Tympanic membrane and ear canal normal.     Left Ear: Tympanic membrane and ear canal normal.     Nose: No congestion or rhinorrhea.     Mouth/Throat:     Pharynx: Pharyngeal swelling and posterior oropharyngeal erythema present.     Tonsils: No tonsillar exudate. 1+ on the right. 1+ on the left.     Comments: Cobblestoning noted to oropharynx. Eyes:     Conjunctiva/sclera: Conjunctivae normal.     Pupils: Pupils are equal, round, and reactive to light.  Cardiovascular:     Rate and Rhythm: Regular rhythm.     Heart sounds: Normal heart sounds.  Musculoskeletal:     Cervical back: Normal range of motion.  Lymphadenopathy:     Cervical: No cervical adenopathy.  Skin:    General: Skin is warm and dry.  Neurological:     General: No focal deficit present.     Mental Status: She is alert and oriented to person, place, and time.  Psychiatric:        Mood and Affect: Mood normal.        Behavior: Behavior normal.      UC Treatments / Results  Labs (all labs ordered are listed, but only abnormal results are displayed) Labs Reviewed  RESP PANEL BY RT-PCR (FLU A&B, COVID) [id number]  POCT RAPID STREP A (OFFICE)    EKG   Radiology No results found.  Procedures Procedures (including critical care time)  Medications Ordered in UC Medications - No data to display  Initial Impression / Assessment and Plan / UC Course  I have reviewed the triage vital signs and the nursing notes.  Pertinent labs & imaging results that were available during my care of the patient were reviewed by me and considered in my medical decision making (see chart for details).  Patient presents with a 1 day history of upper respiratory symptoms.   On exam, patient's vital signs are stable, she is in no acute distress.  Lung sounds are clear throughout.  She does have noted cobblestoning to the oropharynx.  Rapid strep test is negative, COVID test is pending.  Differential diagnoses include allergic rhinitis versus viral upper respiratory infection with cough.  Patient does have a history of smoking.  We will treat patient symptomatically with Promethazine DM, fluticasone, and Zyrtec.  Would like to begin molnupiravir if her COVID test is positive.  Supportive care recommendations were provided to the patient.  Patient advised to follow-up if symptoms fail to improve within the next 7 to 10 days. Final Clinical Impressions(s) / UC Diagnoses   Final diagnoses:  At increased risk of exposure to COVID-19 virus  Viral upper respiratory tract infection with cough  Allergic rhinitis, unspecified seasonality, unspecified trigger     Discharge Instructions      The rapid strep test was positive. A throat culture is pending. You will be contacted if the results are positive. As discussed if the COVID test is positive, you are a candidate to receive  molnupiravir. Take medication as prescribed. Increase fluids and allow for plenty of rest. Recommend Tylenol or ibuprofen as needed for pain, fever, or general discomfort. Warm salt water gargles 3-4 times daily to help with throat pain or discomfort. Recommend using a humidifier at bedtime during sleep to help with cough and nasal congestion. Sleep elevated on 2 pillows. Follow-up if your symptoms do not improve within the next 7 to 10 days.     ED Prescriptions     Medication Sig Dispense Auth. Provider   promethazine-dextromethorphan (PROMETHAZINE-DM) 6.25-15 MG/5ML syrup Take 5 mLs by mouth 4 (four) times daily as needed for cough. 140 mL Rhen Kawecki-Warren, Sadie Haber, NP   cetirizine (ZYRTEC) 10 MG tablet Take 1 tablet (10 mg total) by mouth daily. 30 tablet Christiano Blandon-Warren, Sadie Haber, NP    fluticasone (FLONASE) 50 MCG/ACT nasal spray Place 2 sprays into both nostrils daily. 16 g Minnah Llamas-Warren, Sadie Haber, NP      PDMP not reviewed this encounter.   Abran Cantor, NP 02/16/22 1708

## 2022-02-18 ENCOUNTER — Telehealth: Payer: Self-pay | Admitting: Emergency Medicine

## 2022-02-18 NOTE — Telephone Encounter (Signed)
Pt called and inquired that was feeling a little better but reports due to increased drainage was wondering if work note could be extended from 9/7 to return on 9/8. Work note written to return to work 9/8.pt reports would be by to pick up note and aware will be at [facility] front desk.

## 2022-12-13 ENCOUNTER — Ambulatory Visit
Admission: EM | Admit: 2022-12-13 | Discharge: 2022-12-13 | Disposition: A | Discharge status: Home / Self Care | Payer: Medicaid Other | Attending: Nurse Practitioner | Admitting: Nurse Practitioner

## 2022-12-13 DIAGNOSIS — B029 Zoster without complications: Secondary | ICD-10-CM | POA: Diagnosis not present

## 2022-12-13 MED ORDER — VALACYCLOVIR HCL 1 G PO TABS: 1000.0000 mg | ORAL_TABLET | Freq: Three times a day (TID) | ORAL | 0 refills | Status: AC

## 2022-12-13 NOTE — Discharge Instructions (Signed)
Start valacyclovir 3 times daily for 10 days to treat shingles and prevent new spots from developing If the rash starts to blister and the blisters oozes, this is when it is considered contagious to keep it covered with nonstick gauze and tape Seek care if symptoms persist/worsen despite treatment

## 2022-12-13 NOTE — ED Triage Notes (Signed)
Pt c/o possible shingles. Pt states she has had 2 places that have appeared with a rash since last week that have been causing pain. On left side of back and under left arm.

## 2022-12-13 NOTE — ED Provider Notes (Signed)
[facility]    CSN: [id number] Arrival date & time: 12/13/22  1447      History   Chief Complaint No chief complaint on file.   HPI [name] is a 52 y.o. female.   Patient presents today for rash to left underarm and mid back that has been ongoing for the past week that is burning and painful.  She reports the rash is raised and red.  No blisters or active drainage, fevers, nausea/vomiting since rash began.  Patient denies recent change in soaps, detergents, or personal care products.  No shortness of breath, throat or tongue swelling, or difficulty swallowing.  Patient reports she also has had a psoriasis flare for the past 4 months and this rash is different than that.  The psoriasis flares is on bilateral legs, elbows, and trunk.  Reports that she has a dermatology appointment coming up in a couple of weeks.    Past Medical History:  Diagnosis Date   Anemia    Anxiety    Depression    Fatty liver    Fatty liver    Mental disorder    Panic attack    Substance abuse [facility])     Patient Active Problem List   Diagnosis Date Noted   Menorrhagia with regular cycle 03/07/2018   Iron deficiency anemia due to chronic blood loss 01/09/2018   Low serum ferritin level 01/04/2018   Encounter for well woman exam with routine gynecological exam 01/03/2018   DUB (dysfunctional uterine bleeding) 01/03/2018   Anemia 01/03/2018   Screening for colorectal cancer 01/03/2018   Depression, recurrent ([facility]) 07/15/2016   Panic disorder 07/15/2016   Fatty liver 07/15/2016   Benzodiazepine dependence ([facility]) 07/15/2016   Psoriasis 07/15/2016   Tobacco abuse 07/15/2016    Past Surgical History:  Procedure Laterality Date   BACK SURGERY     DILITATION & CURRETTAGE/HYSTROSCOPY WITH NOVASURE ABLATION N/A 04/05/2018   Procedure: DILATATION & CURETTAGE/HYSTEROSCOPY WITH MINERVA  ABLATION;  Surgeon: Lazaro Arms, MD;  Location: [facility];  Service: Gynecology;  Laterality: N/A;    rotar cuff repair     SPINE SURGERY     TUBAL LIGATION      OB History     Gravida  5   Para  3   Term  3   Preterm      AB  2   Living  3      SAB      IAB      Ectopic      Multiple      Live Births  3            Home Medications    Prior to Admission medications   Medication Sig Start Date End Date Taking? Authorizing Provider  valACYclovir (VALTREX) 1000 MG tablet Take 1 tablet (1,000 mg total) by mouth every 8 (eight) hours for 10 days. 12/13/22 12/23/22 Yes Valentino Nose, NP  acetaminophen (TYLENOL) 500 MG tablet Take by mouth every 6 (six) hours as needed.    [provider]  ALPRAZolam Prudy Feeler) 1 MG tablet Take 1 mg by mouth 3 (three) times daily.    [provider]  cetirizine (ZYRTEC) 10 MG tablet Take 1 tablet (10 mg total) by mouth daily. 02/16/22   Leath-Warren, Sadie Haber, NP  DULoxetine (CYMBALTA) 30 MG capsule TAKE 1 CAPSULE BY MOUTH EVERY DAY 05/17/18   Adline Potter, NP  DULoxetine (CYMBALTA) 60 MG capsule Take by mouth. 04/01/21  [provider]  fluticasone (FLONASE) 50 MCG/ACT nasal spray Place 2 sprays into both nostrils daily. 02/16/22   Leath-Warren, Sadie Haber, NP  ibuprofen (ADVIL,MOTRIN) 800 MG tablet Take 1 tablet (800 mg total) by mouth every 8 (eight) hours as needed. 04/24/18   Lazaro Arms, MD  ketorolac (TORADOL) 10 MG tablet Take 1 tablet (10 mg total) by mouth every 8 (eight) hours as needed. 04/05/18   Lazaro Arms, MD  triamcinolone ointment (KENALOG) 0.5 % Apply 1 application topically 2 (two) times daily. 04/01/18   Eber Hong, MD    Family History Family History  Problem Relation Age of Onset   Emphysema Paternal Grandfather    Emphysema Paternal Grandmother    Breast cancer Maternal Grandmother    Leukemia Maternal Grandmother    Diabetes Maternal Grandmother    Hypertension Maternal Grandmother    Stroke Maternal Grandmother    Heart attack Maternal Grandmother    Cancer  Maternal Grandfather        brain   Panic disorder Father    Hypertension Father    Stroke Father    Diabetes Father    Other Father        heart issues   Heart disease Father    Hyperlipidemia Father    Panic disorder Mother    Hypertension Mother    Anxiety disorder Sister    Panic disorder Sister    Hypertension Son     Social History Social History   Tobacco Use   Smoking status: Every Day    Packs/day: 0.50    Years: 15.00    Additional pack years: 0.00    Total pack years: 7.50    Types: Cigarettes    Start date: 06/14/1984   Smokeless tobacco: Never   Tobacco comments:    DISCUSSED  Vaping Use   Vaping Use: Never used  Substance Use Topics   Alcohol use: Not Currently    Comment: occasionally   Drug use: No     Allergies   Patient has no known allergies.   Review of Systems Review of Systems Per HPI  Physical Exam Triage Vital Signs ED Triage Vitals [12/13/22 1457]  Enc Vitals Group     BP 128/85     Pulse Rate 84     Resp 15     Temp 98.8 F (37.1 C)     Temp Source Oral     SpO2 95 %     Weight      Height      Head Circumference      Peak Flow      Pain Score 6     Pain Loc      Pain Edu?      Excl. in GC?    No data found.  Updated Vital Signs BP 128/85 (BP Location: Right Arm)   Pulse 84   Temp 98.8 F (37.1 C) (Oral)   Resp 15   SpO2 95%   Visual Acuity Right Eye Distance:   Left Eye Distance:   Bilateral Distance:    Right Eye Near:   Left Eye Near:    Bilateral Near:     Physical Exam Vitals and nursing note reviewed.  Constitutional:      General: She is not in acute distress.    Appearance: Normal appearance. She is not toxic-appearing.  HENT:     Head: Normocephalic and atraumatic.     Mouth/Throat:     Mouth: Mucous membranes  are moist.     Pharynx: Oropharynx is clear. No oropharyngeal exudate or posterior oropharyngeal erythema.  Pulmonary:     Effort: Pulmonary effort is normal. No respiratory  distress.  Skin:    Capillary Refill: Capillary refill takes less than 2 seconds.     Findings: Erythema and rash present.     Comments: Erythematous, raised, nodular rash to left underarm and left back seemingly along the same dermatome; no vesicles or blisters, warmth, or drainage.  Skin is exquisitely tender to touch surrounding the rash.  Neurological:     Mental Status: She is alert and oriented to person, place, and time.  Psychiatric:        Behavior: Behavior is cooperative.      UC Treatments / Results  Labs (all labs ordered are listed, but only abnormal results are displayed) Labs Reviewed - No data to display  EKG   Radiology No results found.  Procedures Procedures (including critical care time)  Medications Ordered in UC Medications - No data to display  Initial Impression / Assessment and Plan / UC Course  I have reviewed the triage vital signs and the nursing notes.  Pertinent labs & imaging results that were available during my care of the patient were reviewed by me and considered in my medical decision making (see chart for details).   Patient is well-appearing, normotensive, afebrile, not tachycardic, not tachypneic, oxygenating well on room air.    1. Herpes zoster without complication Start valacyclovir 1000 mg 3 times daily for 10 days Wound care and supportive care discussed with patient Note given for work  The patient was given the opportunity to ask questions.  All questions answered to their satisfaction.  The patient is in agreement to this plan.    Final Clinical Impressions(s) / UC Diagnoses   Final diagnoses:  Herpes zoster without complication     Discharge Instructions      Start valacyclovir 3 times daily for 10 days to treat shingles and prevent new spots from developing If the rash starts to blister and the blisters oozes, this is when it is considered contagious to keep it covered with nonstick gauze and tape Seek care if  symptoms persist/worsen despite treatment    ED Prescriptions     Medication Sig Dispense Auth. Provider   valACYclovir (VALTREX) 1000 MG tablet Take 1 tablet (1,000 mg total) by mouth every 8 (eight) hours for 10 days. 30 tablet Valentino Nose, NP      PDMP not reviewed this encounter.   Valentino Nose, NP 12/13/22 [phone]

## 2023-04-29 ENCOUNTER — Other Ambulatory Visit: Payer: Self-pay

## 2023-04-29 ENCOUNTER — Ambulatory Visit
Admission: EM | Admit: 2023-04-29 | Discharge: 2023-04-29 | Disposition: A | Discharge status: Home / Self Care | Payer: Medicaid Other | Attending: Family Medicine | Admitting: Family Medicine

## 2023-04-29 DIAGNOSIS — F419 Anxiety disorder, unspecified: Secondary | ICD-10-CM | POA: Diagnosis not present

## 2023-04-29 DIAGNOSIS — R030 Elevated blood-pressure reading, without diagnosis of hypertension: Secondary | ICD-10-CM | POA: Diagnosis not present

## 2023-04-29 DIAGNOSIS — R0602 Shortness of breath: Secondary | ICD-10-CM | POA: Diagnosis not present

## 2023-04-29 NOTE — ED Triage Notes (Signed)
Pt reports she has been feeling SOB and elevated BP x 1 day

## 2023-04-29 NOTE — Discharge Instructions (Signed)
As we discussed, we are unable to fully rule out cardiac conditions in the setting but at this time your workup is reassuring.  Follow-up with your primary care provider first the next week for a recheck of your symptoms and go to the emergency department at any time if symptoms worsening.  Continue your anxiety regimen

## 2023-05-01 NOTE — ED Provider Notes (Signed)
[facility]    CSN: [id number] Arrival date & time: 04/29/23  1352      History   Chief Complaint No chief complaint on file.   HPI Jennifer Greer is a 52 y.o. female.   Patient presenting today with 1 day history of intermittent feelings of shortness of breath, elevated blood pressure.  She denies chest pain, diaphoresis, nausea vomiting, left arm pain, jaw pain, dizziness, syncope.  States she has a history of anxiety with panic episodes, states initially she thought that might be what was going on but that it did not feel quite similar to her usual panic attacks.  She takes Cymbalta daily, Xanax as needed.  No known history of chronic pulmonary or cardiac issues.  So far not tried anything over-the-counter for symptoms    Past Medical History:  Diagnosis Date   Anemia    Anxiety    Depression    Fatty liver    Fatty liver    Mental disorder    Panic attack    Substance abuse [facility])     Patient Active Problem List   Diagnosis Date Noted   Menorrhagia with regular cycle 03/07/2018   Iron deficiency anemia due to chronic blood loss 01/09/2018   Low serum ferritin level 01/04/2018   Encounter for well woman exam with routine gynecological exam 01/03/2018   DUB (dysfunctional uterine bleeding) 01/03/2018   Anemia 01/03/2018   Screening for colorectal cancer 01/03/2018   Depression, recurrent (HCC) 07/15/2016   Panic disorder 07/15/2016   Fatty liver 07/15/2016   Benzodiazepine dependence (HCC) 07/15/2016   Psoriasis 07/15/2016   Tobacco abuse 07/15/2016    Past Surgical History:  Procedure Laterality Date   BACK SURGERY     DILITATION & CURRETTAGE/HYSTROSCOPY WITH NOVASURE ABLATION N/A 04/05/2018   Procedure: DILATATION & CURETTAGE/HYSTEROSCOPY WITH MINERVA  ABLATION;  Surgeon: Lazaro Arms, MD;  Location: [facility];  Service: Gynecology;  Laterality: N/A;   rotar cuff repair     SPINE SURGERY     TUBAL LIGATION      OB History     Gravida  5    Para  3   Term  3   Preterm      AB  2   Living  3      SAB      IAB      Ectopic      Multiple      Live Births  3            Home Medications    Prior to Admission medications   Medication Sig Start Date End Date Taking? Authorizing Provider  acetaminophen (TYLENOL) 500 MG tablet Take by mouth every 6 (six) hours as needed.    [provider]  ALPRAZolam Prudy Feeler) 1 MG tablet Take 1 mg by mouth 3 (three) times daily.    [provider]  cetirizine (ZYRTEC) 10 MG tablet Take 1 tablet (10 mg total) by mouth daily. 02/16/22   Leath-Warren, Sadie Haber, NP  DULoxetine (CYMBALTA) 30 MG capsule TAKE 1 CAPSULE BY MOUTH EVERY DAY 05/17/18   Adline Potter, NP  DULoxetine (CYMBALTA) 60 MG capsule Take by mouth. 04/01/21   [provider]  fluticasone (FLONASE) 50 MCG/ACT nasal spray Place 2 sprays into both nostrils daily. 02/16/22   Leath-Warren, Sadie Haber, NP  ibuprofen (ADVIL,MOTRIN) 800 MG tablet Take 1 tablet (800 mg total) by mouth every 8 (eight) hours as needed. 04/24/18   Eure,  Amaryllis Dyke, MD  ketorolac (TORADOL) 10 MG tablet Take 1 tablet (10 mg total) by mouth every 8 (eight) hours as needed. 04/05/18   Lazaro Arms, MD  triamcinolone ointment (KENALOG) 0.5 % Apply 1 application topically 2 (two) times daily. 04/01/18   Eber Hong, MD    Family History Family History  Problem Relation Age of Onset   Emphysema Paternal Grandfather    Emphysema Paternal Grandmother    Breast cancer Maternal Grandmother    Leukemia Maternal Grandmother    Diabetes Maternal Grandmother    Hypertension Maternal Grandmother    Stroke Maternal Grandmother    Heart attack Maternal Grandmother    Cancer Maternal Grandfather        brain   Panic disorder Father    Hypertension Father    Stroke Father    Diabetes Father    Other Father        heart issues   Heart disease Father    Hyperlipidemia Father    Panic disorder Mother    Hypertension  Mother    Anxiety disorder Sister    Panic disorder Sister    Hypertension Son     Social History Social History   Tobacco Use   Smoking status: Every Day    Current packs/day: 0.50    Average packs/day: 0.5 packs/day for 38.9 years (19.4 ttl pk-yrs)    Types: Cigarettes    Start date: 06/14/1984   Smokeless tobacco: Never   Tobacco comments:    DISCUSSED  Vaping Use   Vaping status: Never Used  Substance Use Topics   Alcohol use: Not Currently    Comment: occasionally   Drug use: No     Allergies   Patient has no known allergies.   Review of Systems Review of Systems Per HPI  Physical Exam Triage Vital Signs ED Triage Vitals  Encounter Vitals Group     BP 04/29/23 1359 (!) 155/105     Systolic BP Percentile --      Diastolic BP Percentile --      Pulse Rate 04/29/23 1359 100     Resp 04/29/23 1359 16     Temp 04/29/23 1359 98.1 F (36.7 C)     Temp Source 04/29/23 1359 Oral     SpO2 04/29/23 1359 96 %     Weight --      Height --      Head Circumference --      Peak Flow --      Pain Score 04/29/23 1401 0     Pain Loc --      Pain Education --      Exclude from Growth Chart --    No data found.  Updated Vital Signs BP (!) 135/91 (BP Location: Right Arm)   Pulse 100   Temp 98.1 F (36.7 C) (Oral)   Resp 16   SpO2 96%   Visual Acuity Right Eye Distance:   Left Eye Distance:   Bilateral Distance:    Right Eye Near:   Left Eye Near:    Bilateral Near:     Physical Exam Vitals and nursing note reviewed.  Constitutional:      Appearance: Normal appearance. She is not ill-appearing.  HENT:     Head: Atraumatic.     Mouth/Throat:     Mouth: Mucous membranes are moist.  Eyes:     Extraocular Movements: Extraocular movements intact.     Conjunctiva/sclera: Conjunctivae normal.  Cardiovascular:  Rate and Rhythm: Normal rate and regular rhythm.     Heart sounds: Normal heart sounds.  Pulmonary:     Effort: Pulmonary effort is normal.      Breath sounds: Normal breath sounds. No wheezing or rales.  Musculoskeletal:        General: Normal range of motion.     Cervical back: Normal range of motion and neck supple.  Skin:    General: Skin is warm and dry.  Neurological:     Mental Status: She is alert and oriented to person, place, and time.     Cranial Nerves: No cranial nerve deficit.     Motor: No weakness.     Gait: Gait normal.     Comments: All 4 extremities neurovascular intact  Psychiatric:        Mood and Affect: Mood normal.        Thought Content: Thought content normal.        Judgment: Judgment normal.      UC Treatments / Results  Labs (all labs ordered are listed, but only abnormal results are displayed) Labs Reviewed - No data to display  EKG   Radiology No results found.  Procedures Procedures (including critical care time)  Medications Ordered in UC Medications - No data to display  Initial Impression / Assessment and Plan / UC Course  I have reviewed the triage vital signs and the nursing notes.  Pertinent labs & imaging results that were available during my care of the patient were reviewed by me and considered in my medical decision making (see chart for details).     Blood pressure significantly improved on recheck and only minimally elevated, exam very reassuring today.  She took a Xanax after triage and states this helped significantly with her shortness of breath symptoms and she was feeling much better overall.  EKG today showing sinus tachycardia at 101 bpm with right bundle branch block, no ST elevations or T wave changes noted.  Exam very reassuring today with no abnormalities noted.  Did discuss with her at length that we are unable to fully rule out a heart attack in the setting even with a reassuring EKG and that you need to go to the emergency department for this.  This was declined today as she is feeling much better after taking a Xanax.  Do suspect this symptomology is  related to anxiety and panic episodes, discussed following up with primary care for further evaluation and recheck.  Continue monitoring home blood pressure readings, DASH diet and exercise, stress reduction reviewed.  Final Clinical Impressions(s) / UC Diagnoses   Final diagnoses:  SOB (shortness of breath)  Elevated blood pressure reading  Anxiety     Discharge Instructions      As we discussed, we are unable to fully rule out cardiac conditions in the setting but at this time your workup is reassuring.  Follow-up with your primary care provider first the next week for a recheck of your symptoms and go to the emergency department at any time if symptoms worsening.  Continue your anxiety regimen    ED Prescriptions   None    PDMP not reviewed this encounter.   Particia Nearing, [place] 05/01/23 1231

## 2023-10-18 ENCOUNTER — Encounter: Payer: Self-pay | Admitting: Emergency Medicine

## 2023-10-18 ENCOUNTER — Ambulatory Visit (HOSPITAL_COMMUNITY)
Admission: RE | Admit: 2023-10-18 | Discharge: 2023-10-18 | Disposition: A | Discharge status: Home / Self Care | Source: Ambulatory Visit | Attending: Nurse Practitioner | Admitting: Nurse Practitioner

## 2023-10-18 ENCOUNTER — Ambulatory Visit: Admission: EM | Admit: 2023-10-18 | Discharge: 2023-10-18 | Disposition: A | Discharge status: Home / Self Care

## 2023-10-18 DIAGNOSIS — S61211A Laceration without foreign body of left index finger without damage to nail, initial encounter: Secondary | ICD-10-CM

## 2023-10-18 DIAGNOSIS — M7989 Other specified soft tissue disorders: Secondary | ICD-10-CM | POA: Insufficient documentation

## 2023-10-18 DIAGNOSIS — X58XXXA Exposure to other specified factors, initial encounter: Secondary | ICD-10-CM | POA: Insufficient documentation

## 2023-10-18 DIAGNOSIS — Z23 Encounter for immunization: Secondary | ICD-10-CM

## 2023-10-18 MED ORDER — TETANUS-DIPHTH-ACELL PERTUSSIS 5-2.5-18.5 LF-MCG/0.5 IM SUSY
0.5000 mL | PREFILLED_SYRINGE | Freq: Once | INTRAMUSCULAR | Status: AC
  Administered 2023-10-18: 0.5 mL via INTRAMUSCULAR

## 2023-10-18 NOTE — Discharge Instructions (Addendum)
 You will need to go to [facility] for an x-ray of your left index finger.  Go to the main entrance of the hospital and go to the radiology department.  You will be contacted when results of the x-ray are received. 8 sutures were placed to your index finger today.  You will follow-up in this clinic on 10/25/2023 to have the sutures removed. Keep the dressing in place for the next 24 hours.  A finger splint has also been provided to allow for protection and support of the injury. Recommend over-the-counter Tylenol  as needed for pain or discomfort. You may apply ice to the affected area to help with pain or swelling. Elevate the left hand above the level of the heart is much as possible to help decrease swelling. Cleanse the area with warm water and change her dressing daily.  You may apply over-the-counter antibiotic ointment such as Neosporin to the affected area while symptoms persist. Monitor the area for signs of infection.  This includes increased redness or swelling that goes into the finger or into the hand, foul-smelling drainage from the site, or if you develop fever, chills, or other concerns. Your Tdap was updated today.  It is good for the next 10 years. Follow-up as needed.

## 2023-10-18 NOTE — ED Provider Notes (Signed)
 [facility]    CSN: [id number] Arrival date & time: 10/18/23  1426      History   Chief Complaint No chief complaint on file.   HPI Jennifer Greer is a 53 y.o. female.   The history is provided by the patient.   Patient presents with a laceration to the left index finger that occurred when she was using a hand saw to cut trees in shrubbery.  Patient with moderate bleeding present at this time.  She states that the finger feels numb.  She denies decreased range of motion, or radiation of pain.  She states that her last tetanus shot is out of date.  Patient denies use of blood thinners.  States that she does have a history of anemia.  Past Medical History:  Diagnosis Date   Anemia    Anxiety    Depression    Fatty liver    Fatty liver    Mental disorder    Panic attack    Substance abuse [facility])     Patient Active Problem List   Diagnosis Date Noted   Menorrhagia with regular cycle 03/07/2018   Iron deficiency anemia due to chronic blood loss 01/09/2018   Low serum ferritin level 01/04/2018   Encounter for well woman exam with routine gynecological exam 01/03/2018   DUB (dysfunctional uterine bleeding) 01/03/2018   Anemia 01/03/2018   Screening for colorectal cancer 01/03/2018   Depression, recurrent (HCC) 07/15/2016   Panic disorder 07/15/2016   Fatty liver 07/15/2016   Benzodiazepine dependence (HCC) 07/15/2016   Psoriasis 07/15/2016   Tobacco abuse 07/15/2016    Past Surgical History:  Procedure Laterality Date   BACK SURGERY     DILITATION & CURRETTAGE/HYSTROSCOPY WITH NOVASURE ABLATION N/A 04/05/2018   Procedure: DILATATION & CURETTAGE/HYSTEROSCOPY WITH MINERVA  ABLATION;  Surgeon: Wendelyn Halter, MD;  Location: [facility];  Service: Gynecology;  Laterality: N/A;   rotar cuff repair     SPINE SURGERY     TUBAL LIGATION      OB History     Gravida  5   Para  3   Term  3   Preterm      AB  2   Living  3      SAB      IAB       Ectopic      Multiple      Live Births  3            Home Medications    Prior to Admission medications   Medication Sig Start Date End Date Taking? Authorizing Provider  buPROPion (WELLBUTRIN XL) 150 MG 24 hr tablet Take 150 mg by mouth daily.   Yes [provider]  acetaminophen  (TYLENOL ) 500 MG tablet Take by mouth every 6 (six) hours as needed.    [provider]  ALPRAZolam [place]) 1 MG tablet Take 1 mg by mouth 3 (three) times daily.    [provider]  cetirizine  (ZYRTEC ) 10 MG tablet Take 1 tablet (10 mg total) by mouth daily. 02/16/22   Leath-Warren, Belen Bowers, NP  DULoxetine  (CYMBALTA ) 30 MG capsule TAKE 1 CAPSULE BY MOUTH EVERY DAY 05/17/18   Javan Messing, NP  DULoxetine  (CYMBALTA ) 60 MG capsule Take by mouth. 04/01/21   [provider]  fluticasone  (FLONASE ) 50 MCG/ACT nasal spray Place 2 sprays into both nostrils daily. 02/16/22   Leath-Warren, Belen Bowers, NP  ibuprofen  (ADVIL ,MOTRIN ) 800 MG tablet Take 1 tablet (  800 mg total) by mouth every 8 (eight) hours as needed. 04/24/18   Wendelyn Halter, MD  triamcinolone  ointment (KENALOG ) 0.5 % Apply 1 application topically 2 (two) times daily. 04/01/18   Early Glisson, MD    Family History Family History  Problem Relation Age of Onset   Emphysema Paternal Grandfather    Emphysema Paternal Grandmother    Breast cancer Maternal Grandmother    Leukemia Maternal Grandmother    Diabetes Maternal Grandmother    Hypertension Maternal Grandmother    Stroke Maternal Grandmother    Heart attack Maternal Grandmother    Cancer Maternal Grandfather        brain   Panic disorder Father    Hypertension Father    Stroke Father    Diabetes Father    Other Father        heart issues   Heart disease Father    Hyperlipidemia Father    Panic disorder Mother    Hypertension Mother    Anxiety disorder Sister    Panic disorder Sister    Hypertension Son     Social History Social History    Tobacco Use   Smoking status: Every Day    Current packs/day: 0.50    Average packs/day: 0.5 packs/day for 39.3 years (19.7 ttl pk-yrs)    Types: Cigarettes    Start date: 06/14/1984   Smokeless tobacco: Never   Tobacco comments:    DISCUSSED  Vaping Use   Vaping status: Never Used  Substance Use Topics   Alcohol use: Not Currently    Comment: occasionally   Drug use: No     Allergies   Patient has no known allergies.   Review of Systems Review of Systems Per HPI  Physical Exam Triage Vital Signs ED Triage Vitals  Encounter Vitals Group     BP 10/18/23 1435 (!) 147/88     Systolic BP Percentile --      Diastolic BP Percentile --      Pulse Rate 10/18/23 1435 (!) 110     Resp 10/18/23 1435 18     Temp 10/18/23 1435 98.5 F (36.9 C)     Temp Source 10/18/23 1435 Oral     SpO2 10/18/23 1435 97 %     Weight --      Height --      Head Circumference --      Peak Flow --      Pain Score 10/18/23 1434 8     Pain Loc --      Pain Education --      Exclude from Growth Chart --    No data found.  Updated Vital Signs BP (!) 147/88 (BP Location: Right Arm)   Pulse (!) 110   Temp 98.5 F (36.9 C) (Oral)   Resp 18   SpO2 97%   Visual Acuity Right Eye Distance:   Left Eye Distance:   Bilateral Distance:    Right Eye Near:   Left Eye Near:    Bilateral Near:     Physical Exam Vitals and nursing note reviewed.  Constitutional:      General: She is not in acute distress.    Appearance: Normal appearance.  HENT:     Head: Normocephalic.  Eyes:     Pupils: Pupils are equal, round, and reactive to light.  Cardiovascular:     Rate and Rhythm: Tachycardia present.     Pulses: Normal pulses.     Heart sounds: Normal heart  sounds.  Pulmonary:     Effort: Pulmonary effort is normal.  Musculoskeletal:     Cervical back: Normal range of motion.  Skin:    General: Skin is warm and dry.     Findings: Laceration present.     Comments: Approximate 2 cm linear  laceration to the left index finger at the base of the proximal phalanx. Moderate bleeding at this time.  Cap refill < 2 sec, neurovascularly intact.  Neurological:     General: No focal deficit present.     Mental Status: She is alert and oriented to person, place, and time.  Psychiatric:        Mood and Affect: Mood normal.        Behavior: Behavior normal.      UC Treatments / Results  Labs (all labs ordered are listed, but only abnormal results are displayed) Labs Reviewed - No data to display  EKG   Radiology DG Finger Index Left Result Date: 10/18/2023 CLINICAL DATA:  Cut with saw. EXAM: LEFT INDEX FINGER 2+V COMPARISON:  None Available. FINDINGS: There is soft tissue swelling and laceration of the medial aspect of the proximal second phalanx. There is no radiopaque foreign body. There is no acute fracture or dislocation. Joint spaces are well maintained. IMPRESSION: Soft tissue swelling and laceration of the medial aspect of the proximal second phalanx. No acute fracture or dislocation. Electronically Signed   By: Tyron Gallon M.D.   On: 10/18/2023 16:38    Procedures Laceration Repair  Date/Time: 10/18/2023 3:37 PM  Performed by: Hardy Lia, NP Authorized by: Hardy Lia, NP   Consent:    Consent obtained:  Verbal   Consent given by:  Patient   Risks discussed:  Infection, need for additional repair, nerve damage, poor cosmetic result and poor wound healing   Alternatives discussed:  No treatment Universal protocol:    Procedure explained and questions answered to patient or proxy's satisfaction: yes     Patient identity confirmed:  Verbally with patient Anesthesia:    Anesthesia method:  Local infiltration   Local anesthetic:  Lidocaine  2% WITH epi Laceration details:    Location:  Finger   Finger location:  L index finger   Length (cm):  2 Treatment:    Wound cleansed with: Hibiclens soap and sterile water.   Amount of cleaning:   Extensive   Irrigation solution:  Sterile water Skin repair:    Repair method:  Sutures   Suture size:  4-0   Suture technique:  Simple interrupted   Number of sutures:  8 Approximation:    Approximation:  Close Repair type:    Repair type:  Simple Post-procedure details:    Dressing:  Antibiotic ointment and non-adherent dressing (Coban)   Procedure completion:  Tolerated Comments:     Patient with approximate 2 cm linear laceration to the left index finger at the base of the proximal phalanx.  Site was cleansed with Hibiclens soap and sterile water.  2% lidocaine  with epi used to help control bleeding, 2 mL used for anesthesia.  8 simple interrupted sutures using 4.0 Prolene were placed.  Patient tolerated the procedure well.  (including critical care time)  Medications Ordered in UC Medications  Tdap (BOOSTRIX) injection 0.5 mL (0.5 mLs Intramuscular Given 10/18/23 1444)    Initial Impression / Assessment and Plan / UC Course  I have reviewed the triage vital signs and the nursing notes.  Pertinent labs & imaging results that were available  during my care of the patient were reviewed by me and considered in my medical decision making (see chart for details).  Patient with approximate 2cm laceration to the base of the proximal first phalanx of the left index finger.  8 simple interrupted sutures were placed.  Patient tolerated the procedure well.  Antibiotic ointment with nonadherent gauze dressing and Coban were placed.  Finger splint was provided to allow for protection and support.  Supportive care recommendations were provided and discussed with the patient to include over-the-counter analgesics, the use of ice as needed for pain or swelling, and to elevate the left hand is much as possible.  Patient was also given instructions regarding signs for follow-up.  Will keep sutures in place for 7 days.  Patient was in agreement with this plan of care and verbalized understanding.  All  questions were answered.  Patient stable for discharge.  Update 1701: Received x-ray results of the left index finger.  Call patient to discuss x-ray results, verified patient with 2 patient identifiers.  Patient was advised that x-ray was negative for fracture or dislocation, continue with current treatment plan.  Patient was in agreement with this plan of care and verbalized understanding.  All questions were answered. Final Clinical Impressions(s) / UC Diagnoses   Final diagnoses:  Laceration of left index finger without foreign body without damage to nail, initial encounter     Discharge Instructions      You will need to go to [facility] for an x-ray of your left index finger.  Go to the main entrance of the hospital and go to the radiology department.  You will be contacted when results of the x-ray are received. 8 sutures were placed to your index finger today.  You will follow-up in this clinic on 10/25/2023 to have the sutures removed. Keep the dressing in place for the next 24 hours.  A finger splint has also been provided to allow for protection and support of the injury. Recommend over-the-counter Tylenol  as needed for pain or discomfort. You may apply ice to the affected area to help with pain or swelling. Elevate the left hand above the level of the heart is much as possible to help decrease swelling. Cleanse the area with warm water and change her dressing daily.  You may apply over-the-counter antibiotic ointment such as Neosporin to the affected area while symptoms persist. Monitor the area for signs of infection.  This includes increased redness or swelling that goes into the finger or into the hand, foul-smelling drainage from the site, or if you develop fever, chills, or other concerns. Your Tdap was updated today.  It is good for the next 10 years. Follow-up as needed.     ED Prescriptions   None    PDMP not reviewed this encounter.   Hardy Lia, NP 10/18/23 1702

## 2023-10-18 NOTE — ED Triage Notes (Signed)
 Cut left index finger with a hand saw today.  States needs an updated tetanus.

## 2023-10-25 ENCOUNTER — Ambulatory Visit
Admission: EM | Admit: 2023-10-25 | Discharge: 2023-10-25 | Disposition: A | Discharge status: Home / Self Care | Attending: Family Medicine | Admitting: Family Medicine

## 2023-10-25 DIAGNOSIS — S61211A Laceration without foreign body of left index finger without damage to nail, initial encounter: Secondary | ICD-10-CM

## 2023-10-25 DIAGNOSIS — L089 Local infection of the skin and subcutaneous tissue, unspecified: Secondary | ICD-10-CM

## 2023-10-25 DIAGNOSIS — T148XXA Other injury of unspecified body region, initial encounter: Secondary | ICD-10-CM | POA: Diagnosis not present

## 2023-10-25 MED ORDER — CEPHALEXIN 500 MG PO CAPS: 500.0000 mg | ORAL_CAPSULE | Freq: Two times a day (BID) | ORAL | 0 refills | Status: AC

## 2023-10-25 NOTE — ED Provider Notes (Signed)
 [facility]    CSN: [id number] Arrival date & time: 10/25/23  1650      History   Chief Complaint No chief complaint on file.   HPI Jennifer Greer is a 53 y.o. female.   Patient presenting today following up for evaluation for suture removal.  States had 8 sutures placed to the left index finger on 10/18/2023.  Has been keeping the area clean, dressed and wearing splint for immobilization but states the past few days the area has become more red, swollen, having thick drainage and painful.  Denies fever, chills, numbness, loss of range of motion, nausea, vomiting.    Past Medical History:  Diagnosis Date   Anemia    Anxiety    Depression    Fatty liver    Fatty liver    Mental disorder    Panic attack    Substance abuse [facility])     Patient Active Problem List   Diagnosis Date Noted   Menorrhagia with regular cycle 03/07/2018   Iron deficiency anemia due to chronic blood loss 01/09/2018   Low serum ferritin level 01/04/2018   Encounter for well woman exam with routine gynecological exam 01/03/2018   DUB (dysfunctional uterine bleeding) 01/03/2018   Anemia 01/03/2018   Screening for colorectal cancer 01/03/2018   Depression, recurrent (HCC) 07/15/2016   Panic disorder 07/15/2016   Fatty liver 07/15/2016   Benzodiazepine dependence (HCC) 07/15/2016   Psoriasis 07/15/2016   Tobacco abuse 07/15/2016    Past Surgical History:  Procedure Laterality Date   BACK SURGERY     DILITATION & CURRETTAGE/HYSTROSCOPY WITH NOVASURE ABLATION N/A 04/05/2018   Procedure: DILATATION & CURETTAGE/HYSTEROSCOPY WITH MINERVA  ABLATION;  Surgeon: Wendelyn Halter, MD;  Location: [facility];  Service: Gynecology;  Laterality: N/A;   rotar cuff repair     SPINE SURGERY     TUBAL LIGATION      OB History     Gravida  5   Para  3   Term  3   Preterm      AB  2   Living  3      SAB      IAB      Ectopic      Multiple      Live Births  3            Home  Medications    Prior to Admission medications   Medication Sig Start Date End Date Taking? Authorizing Provider  cephALEXin  (KEFLEX ) 500 MG capsule Take 1 capsule (500 mg total) by mouth 2 (two) times daily. 10/25/23  Yes Corbin Dess, PA-C  acetaminophen  (TYLENOL ) 500 MG tablet Take by mouth every 6 (six) hours as needed.    [provider]  ALPRAZolam [place]) 1 MG tablet Take 1 mg by mouth 3 (three) times daily.    [provider]  buPROPion (WELLBUTRIN XL) 150 MG 24 hr tablet Take 150 mg by mouth daily.    [provider]  cetirizine  (ZYRTEC ) 10 MG tablet Take 1 tablet (10 mg total) by mouth daily. 02/16/22   Leath-Warren, Belen Bowers, NP  DULoxetine  (CYMBALTA ) 30 MG capsule TAKE 1 CAPSULE BY MOUTH EVERY DAY 05/17/18   Javan Messing, NP  DULoxetine  (CYMBALTA ) 60 MG capsule Take by mouth. 04/01/21   [provider]  fluticasone  (FLONASE ) 50 MCG/ACT nasal spray Place 2 sprays into both nostrils daily. 02/16/22   Leath-Warren, Belen Bowers, NP  ibuprofen  (ADVIL ,MOTRIN ) 800 MG tablet  Take 1 tablet (800 mg total) by mouth every 8 (eight) hours as needed. 04/24/18   Wendelyn Halter, MD  triamcinolone  ointment (KENALOG ) 0.5 % Apply 1 application topically 2 (two) times daily. 04/01/18   Early Glisson, MD    Family History Family History  Problem Relation Age of Onset   Emphysema Paternal Grandfather    Emphysema Paternal Grandmother    Breast cancer Maternal Grandmother    Leukemia Maternal Grandmother    Diabetes Maternal Grandmother    Hypertension Maternal Grandmother    Stroke Maternal Grandmother    Heart attack Maternal Grandmother    Cancer Maternal Grandfather        brain   Panic disorder Father    Hypertension Father    Stroke Father    Diabetes Father    Other Father        heart issues   Heart disease Father    Hyperlipidemia Father    Panic disorder Mother    Hypertension Mother    Anxiety disorder Sister    Panic disorder  Sister    Hypertension Son     Social History Social History   Tobacco Use   Smoking status: Every Day    Current packs/day: 0.50    Average packs/day: 0.5 packs/day for 39.4 years (19.7 ttl pk-yrs)    Types: Cigarettes    Start date: 06/14/1984   Smokeless tobacco: Never   Tobacco comments:    DISCUSSED  Vaping Use   Vaping status: Never Used  Substance Use Topics   Alcohol use: Not Currently    Comment: occasionally   Drug use: No     Allergies   Patient has no known allergies.   Review of Systems Review of Systems PER HPI  Physical Exam Triage Vital Signs ED Triage Vitals [10/25/23 1705]  Encounter Vitals Group     BP 127/87     Systolic BP Percentile      Diastolic BP Percentile      Pulse Rate 86     Resp 20     Temp 98.5 F (36.9 C)     Temp Source Oral     SpO2 94 %     Weight      Height      Head Circumference      Peak Flow      Pain Score 6     Pain Loc      Pain Education      Exclude from Growth Chart    No data found.  Updated Vital Signs BP 127/87 (BP Location: Right Arm)   Pulse 86   Temp 98.5 F (36.9 C) (Oral)   Resp 20   SpO2 94%   Visual Acuity Right Eye Distance:   Left Eye Distance:   Bilateral Distance:    Right Eye Near:   Left Eye Near:    Bilateral Near:     Physical Exam Vitals and nursing note reviewed.  Constitutional:      Appearance: Normal appearance. She is not ill-appearing.  HENT:     Head: Atraumatic.  Eyes:     Extraocular Movements: Extraocular movements intact.     Conjunctiva/sclera: Conjunctivae normal.  Cardiovascular:     Rate and Rhythm: Normal rate.  Pulmonary:     Effort: Pulmonary effort is normal.  Musculoskeletal:        General: Normal range of motion.     Cervical back: Normal range of motion and neck supple.  Skin:  General: Skin is warm.     Findings: Erythema present.     Comments: Sutures intact to the laceration of the left index finger, surrounding erythema, edema,  dried drainage present to the central area  Neurological:     Mental Status: She is alert and oriented to person, place, and time.     Comments: Left upper extremity neurovascularly intact  Psychiatric:        Mood and Affect: Mood normal.        Thought Content: Thought content normal.        Judgment: Judgment normal.      UC Treatments / Results  Labs (all labs ordered are listed, but only abnormal results are displayed) Labs Reviewed - No data to display  EKG   Radiology No results found.  Procedures Procedures (including critical care time)  Medications Ordered in UC Medications - No data to display  Initial Impression / Assessment and Plan / UC Course  I have reviewed the triage vital signs and the nursing notes.  Pertinent labs & imaging results that were available during my care of the patient were reviewed by me and considered in my medical decision making (see chart for details).     Wound does appear to be getting infected so we will start Keflex  in addition to good home wound care.  Will forego suture removal today till infection is under better control.  Discussed to return in 2 to 3 days for recheck and possible suture removal.  Final Clinical Impressions(s) / UC Diagnoses   Final diagnoses:  Laceration of left index finger without foreign body without damage to nail, initial encounter  Infected laceration     Discharge Instructions      I have placed you on a course of antibiotics today.  Continue cleaning the wound at least once a day with soap and water or Hibiclens solution, applying triple antibiotic ointment or Vaseline and a nonstick gauze pad.  Follow-up in about 3 days for reevaluation and consideration for suture removal  ED Prescriptions     Medication Sig Dispense Auth. Provider   cephALEXin  (KEFLEX ) 500 MG capsule Take 1 capsule (500 mg total) by mouth 2 (two) times daily. 14 capsule Corbin Dess, [place]      PDMP not  reviewed this encounter.   Corbin Dess, [place] 10/25/23 1733

## 2023-10-25 NOTE — ED Triage Notes (Signed)
 Pt reports she has left index finger pain and discharge  where her 8 sutures where placed. Pt was to return to UC today for suture removal.   States she cleaned it and applied  neosporins to the area.

## 2023-10-25 NOTE — Discharge Instructions (Signed)
 I have placed you on a course of antibiotics today.  Continue cleaning the wound at least once a day with soap and water or Hibiclens solution, applying triple antibiotic ointment or Vaseline and a nonstick gauze pad.  Follow-up in about 3 days for reevaluation and consideration for suture removal

## 2023-10-28 ENCOUNTER — Ambulatory Visit: Admission: EM | Admit: 2023-10-28 | Discharge: 2023-10-28 | Disposition: A | Discharge status: Home / Self Care

## 2023-10-28 DIAGNOSIS — S61211D Laceration without foreign body of left index finger without damage to nail, subsequent encounter: Secondary | ICD-10-CM

## 2023-10-28 DIAGNOSIS — T8130XA Disruption of wound, unspecified, initial encounter: Secondary | ICD-10-CM

## 2023-10-28 NOTE — Discharge Instructions (Signed)
 We have placed derma clips today to help relieve tension off of the wound site.  Continue taking your antibiotics, dressing the wound and keeping it covered and splinted in a neutral position to help encourage healing.  Follow-up with your primary care provider next week for a recheck.  Turn sooner for worsening symptoms.

## 2023-10-28 NOTE — ED Triage Notes (Signed)
 Pt reports she has itching in her left index finger. States "I hope its not infected again"   Presents to get 8 sutures removed from her finger

## 2023-11-03 NOTE — ED Provider Notes (Signed)
 [facility]    CSN: [id number] Arrival date & time: 10/28/23  1507      History   Chief Complaint No chief complaint on file.   HPI Jennifer Greer is a 53 y.o. female.   Patient presenting today following up for wound recheck for laceration to the left index finger that occurred 10/18/2023.  She followed up on 10/25/2023 for consideration of suture removal where the wound was found to be significantly infected.  She was started on Keflex  and told to return in 3 days for consideration of suture removal which is leading us  to today.  She states the wound is feeling a bit better but still swollen and tender.  Denies drainage, fever, chills, worsening pain or redness.  Has continued home wound care and splinting.    Past Medical History:  Diagnosis Date   Anemia    Anxiety    Depression    Fatty liver    Fatty liver    Mental disorder    Panic attack    Substance abuse [facility])     Patient Active Problem List   Diagnosis Date Noted   Menorrhagia with regular cycle 03/07/2018   Iron deficiency anemia due to chronic blood loss 01/09/2018   Low serum ferritin level 01/04/2018   Encounter for well woman exam with routine gynecological exam 01/03/2018   DUB (dysfunctional uterine bleeding) 01/03/2018   Anemia 01/03/2018   Screening for colorectal cancer 01/03/2018   Depression, recurrent (HCC) 07/15/2016   Panic disorder 07/15/2016   Fatty liver 07/15/2016   Benzodiazepine dependence (HCC) 07/15/2016   Psoriasis 07/15/2016   Tobacco abuse 07/15/2016    Past Surgical History:  Procedure Laterality Date   BACK SURGERY     DILITATION & CURRETTAGE/HYSTROSCOPY WITH NOVASURE ABLATION N/A 04/05/2018   Procedure: DILATATION & CURETTAGE/HYSTEROSCOPY WITH MINERVA  ABLATION;  Surgeon: Wendelyn Halter, MD;  Location: [facility];  Service: Gynecology;  Laterality: N/A;   rotar cuff repair     SPINE SURGERY     TUBAL LIGATION      OB History     Gravida  5   Para  3   Term   3   Preterm      AB  2   Living  3      SAB      IAB      Ectopic      Multiple      Live Births  3            Home Medications    Prior to Admission medications   Medication Sig Start Date End Date Taking? Authorizing Provider  acetaminophen  (TYLENOL ) 500 MG tablet Take by mouth every 6 (six) hours as needed.    [provider]  ALPRAZolam [place]) 1 MG tablet Take 1 mg by mouth 3 (three) times daily.    [provider]  buPROPion (WELLBUTRIN XL) 150 MG 24 hr tablet Take 150 mg by mouth daily.    [provider]  cephALEXin  (KEFLEX ) 500 MG capsule Take 1 capsule (500 mg total) by mouth 2 (two) times daily. 10/25/23   Corbin Dess, PA-C  cetirizine  (ZYRTEC ) 10 MG tablet Take 1 tablet (10 mg total) by mouth daily. 02/16/22   Leath-Warren, Belen Bowers, NP  DULoxetine  (CYMBALTA ) 30 MG capsule TAKE 1 CAPSULE BY MOUTH EVERY DAY 05/17/18   Javan Messing, NP  DULoxetine  (CYMBALTA ) 60 MG capsule Take by mouth. 04/01/21   [provider]  fluticasone  (FLONASE ) 50 MCG/ACT nasal spray Place 2 sprays into both nostrils daily. 02/16/22   Leath-Warren, Belen Bowers, NP  ibuprofen  (ADVIL ,MOTRIN ) 800 MG tablet Take 1 tablet (800 mg total) by mouth every 8 (eight) hours as needed. 04/24/18   Wendelyn Halter, MD  triamcinolone  ointment (KENALOG ) 0.5 % Apply 1 application topically 2 (two) times daily. 04/01/18   Early Glisson, MD    Family History Family History  Problem Relation Age of Onset   Emphysema Paternal Grandfather    Emphysema Paternal Grandmother    Breast cancer Maternal Grandmother    Leukemia Maternal Grandmother    Diabetes Maternal Grandmother    Hypertension Maternal Grandmother    Stroke Maternal Grandmother    Heart attack Maternal Grandmother    Cancer Maternal Grandfather        brain   Panic disorder Father    Hypertension Father    Stroke Father    Diabetes Father    Other Father        heart issues   Heart  disease Father    Hyperlipidemia Father    Panic disorder Mother    Hypertension Mother    Anxiety disorder Sister    Panic disorder Sister    Hypertension Son     Social History Social History   Tobacco Use   Smoking status: Every Day    Current packs/day: 0.50    Average packs/day: 0.5 packs/day for 39.4 years (19.7 ttl pk-yrs)    Types: Cigarettes    Start date: 06/14/1984   Smokeless tobacco: Never   Tobacco comments:    DISCUSSED  Vaping Use   Vaping status: Never Used  Substance Use Topics   Alcohol use: Not Currently    Comment: occasionally   Drug use: No     Allergies   Patient has no known allergies.   Review of Systems Review of Systems Per HPI  Physical Exam Triage Vital Signs ED Triage Vitals  Encounter Vitals Group     BP 10/28/23 1523 (!) 135/92     Systolic BP Percentile --      Diastolic BP Percentile --      Pulse Rate 10/28/23 1523 79     Resp 10/28/23 1523 18     Temp 10/28/23 1523 98.1 F (36.7 C)     Temp Source 10/28/23 1523 Oral     SpO2 10/28/23 1523 96 %     Weight --      Height --      Head Circumference --      Peak Flow --      Pain Score 10/28/23 1550 0     Pain Loc --      Pain Education --      Exclude from Growth Chart --    No data found.  Updated Vital Signs BP (!) 135/92 (BP Location: Right Arm)   Pulse 79   Temp 98.1 F (36.7 C) (Oral)   Resp 18   SpO2 96%   Visual Acuity Right Eye Distance:   Left Eye Distance:   Bilateral Distance:    Right Eye Near:   Left Eye Near:    Bilateral Near:     Physical Exam Vitals and nursing note reviewed.  Constitutional:      Appearance: Normal appearance. She is not ill-appearing.  HENT:     Head: Atraumatic.  Eyes:     Extraocular Movements: Extraocular movements intact.     Conjunctiva/sclera: Conjunctivae normal.  Cardiovascular:     Rate and Rhythm: Normal rate.  Pulmonary:     Effort: Pulmonary effort is normal.  Musculoskeletal:        General:  Normal range of motion.     Cervical back: Normal range of motion and neck supple.  Skin:    General: Skin is warm.     Comments: Sutures left index finger becoming embedded in certain areas.  Erythema, edema from visit 3 days ago improved, no purulent drainage, bleeding present  Neurological:     Mental Status: She is alert and oriented to person, place, and time.     Comments: Left upper extremity neurovascularly intact  Psychiatric:        Mood and Affect: Mood normal.        Thought Content: Thought content normal.        Judgment: Judgment normal.      UC Treatments / Results  Labs (all labs ordered are listed, but only abnormal results are displayed) Labs Reviewed - No data to display  EKG   Radiology No results found.  Procedures Procedures (including critical care time)  Medications Ordered in UC Medications - No data to display  Initial Impression / Assessment and Plan / UC Course  I have reviewed the triage vital signs and the nursing notes.  Pertinent labs & imaging results that were available during my care of the patient were reviewed by me and considered in my medical decision making (see chart for details).     Wound infection does appear to be resolving on the Keflex  but instructed to continue course as well as home wound care and splinting and follow-up with PCP as soon as possible for wound recheck.  Sutures appear to be becoming fairly embedded so discussed risk first benefit of going ahead and removing them today.  Sutures were removed, but some wound dehiscence did occur so derma clips were placed to take tension off the wound while it heals via secondary intention.  Continue home wound care, splinting and close PCP follow-up to monitor wound healing  Final Clinical Impressions(s) / UC Diagnoses   Final diagnoses:  Laceration of left index finger without foreign body without damage to nail, subsequent encounter  Wound dehiscence     Discharge  Instructions      We have placed derma clips today to help relieve tension off of the wound site.  Continue taking your antibiotics, dressing the wound and keeping it covered and splinted in a neutral position to help encourage healing.  Follow-up with your primary care provider next week for a recheck.  Turn sooner for worsening symptoms.  ED Prescriptions   None    PDMP not reviewed this encounter.   Corbin Dess, [place] 11/03/23 1326

## 2023-11-14 ENCOUNTER — Encounter: Admitting: Adult Health

## 2024-07-03 ENCOUNTER — Other Ambulatory Visit (HOSPITAL_COMMUNITY)
Admission: RE | Admit: 2024-07-03 | Discharge: 2024-07-03 | Disposition: A | Discharge status: Home / Self Care | Source: Ambulatory Visit | Attending: Physician Assistant | Admitting: Physician Assistant

## 2024-07-03 DIAGNOSIS — L400 Psoriasis vulgaris: Secondary | ICD-10-CM | POA: Diagnosis present

## 2024-07-03 DIAGNOSIS — Z79899 Other long term (current) drug therapy: Secondary | ICD-10-CM | POA: Insufficient documentation

## 2024-07-03 DIAGNOSIS — L4050 Arthropathic psoriasis, unspecified: Secondary | ICD-10-CM | POA: Diagnosis not present

## 2024-07-03 LAB — CBC WITH DIFFERENTIAL/PLATELET
Abs Immature Granulocytes: 0.01 K/uL (ref 0.00–0.07)
Basophils Absolute: 0 K/uL (ref 0.0–0.1)
Basophils Relative: 1 %
Eosinophils Absolute: 0.1 K/uL (ref 0.0–0.5)
Eosinophils Relative: 1 %
HCT: 46.6 % — ABNORMAL HIGH (ref 36.0–46.0)
Hemoglobin: 15.7 g/dL — ABNORMAL HIGH (ref 12.0–15.0)
Immature Granulocytes: 0 %
Lymphocytes Relative: 28 %
Lymphs Abs: 1.5 K/uL (ref 0.7–4.0)
MCH: 29.3 pg (ref 26.0–34.0)
MCHC: 33.7 g/dL (ref 30.0–36.0)
MCV: 87.1 fL (ref 80.0–100.0)
Monocytes Absolute: 0.5 K/uL (ref 0.1–1.0)
Monocytes Relative: 8 %
Neutro Abs: 3.5 K/uL (ref 1.7–7.7)
Neutrophils Relative %: 62 %
Platelets: 270 K/uL (ref 150–400)
RBC: 5.35 MIL/uL — ABNORMAL HIGH (ref 3.87–5.11)
RDW: 13.7 % (ref 11.5–15.5)
WBC: 5.6 K/uL (ref 4.0–10.5)
nRBC: 0 % (ref 0.0–0.2)

## 2024-07-03 LAB — HEPATITIS PANEL, ACUTE
HCV Ab: NONREACTIVE
Hep A IgM: NONREACTIVE
Hep B C IgM: NONREACTIVE
Hepatitis B Surface Ag: NONREACTIVE

## 2024-07-03 LAB — COMPREHENSIVE METABOLIC PANEL WITH GFR
ALT: 11 U/L (ref 0–44)
AST: 16 U/L (ref 15–41)
Albumin: 4.3 g/dL (ref 3.5–5.0)
Alkaline Phosphatase: 85 U/L (ref 38–126)
Anion gap: 14 (ref 5–15)
BUN: 15 mg/dL (ref 6–20)
CO2: 20 mmol/L — ABNORMAL LOW (ref 22–32)
Calcium: 8.9 mg/dL (ref 8.9–10.3)
Chloride: 104 mmol/L (ref 98–111)
Creatinine, Ser: 0.82 mg/dL (ref 0.44–1.00)
GFR, Estimated: >60 mL/min
Glucose, Bld: 94 mg/dL (ref 70–99)
Potassium: 4 mmol/L (ref 3.5–5.1)
Sodium: 138 mmol/L (ref 135–145)
Total Bilirubin: 0.5 mg/dL (ref 0.0–1.2)
Total Protein: 7.6 g/dL (ref 6.5–8.1)

## 2024-07-05 LAB — QUANTIFERON-TB GOLD PLUS (RQFGPL)
QuantiFERON Mitogen Value: >10 [IU]/mL
QuantiFERON Nil Value: 0.07 [IU]/mL
QuantiFERON TB1 Ag Value: 0.07 [IU]/mL
QuantiFERON TB2 Ag Value: 0.19 [IU]/mL

## 2024-07-05 LAB — QUANTIFERON-TB GOLD PLUS: QuantiFERON-TB Gold Plus: NEGATIVE
